# Patient Record
Sex: Female | Born: 1950 | Race: Black or African American | Hispanic: No | Smoking: Former smoker
Health system: Southern US, Community
[De-identification: ages and names within clinical notes are randomized; demographics above are authoritative.]

## PROBLEM LIST (undated history)

## (undated) DIAGNOSIS — I10 Essential (primary) hypertension: Secondary | ICD-10-CM

## (undated) DIAGNOSIS — R569 Unspecified convulsions: Secondary | ICD-10-CM

## (undated) DIAGNOSIS — K219 Gastro-esophageal reflux disease without esophagitis: Secondary | ICD-10-CM

## (undated) DIAGNOSIS — F419 Anxiety disorder, unspecified: Secondary | ICD-10-CM

## (undated) DIAGNOSIS — C801 Malignant (primary) neoplasm, unspecified: Secondary | ICD-10-CM

## (undated) DIAGNOSIS — M797 Fibromyalgia: Secondary | ICD-10-CM

## (undated) DIAGNOSIS — I5189 Other ill-defined heart diseases: Secondary | ICD-10-CM

## (undated) HISTORY — PX: HEMORRHOID SURGERY: SHX153

---

## 2001-03-19 ENCOUNTER — Ambulatory Visit (HOSPITAL_COMMUNITY): Admission: RE | Admit: 2001-03-19 | Discharge: 2001-03-19 | Payer: Self-pay | Admitting: General Practice

## 2001-03-19 ENCOUNTER — Encounter: Payer: Self-pay | Admitting: General Practice

## 2004-01-09 ENCOUNTER — Emergency Department (HOSPITAL_COMMUNITY): Admission: AD | Admit: 2004-01-09 | Discharge: 2004-01-10 | Payer: Self-pay | Admitting: Family Medicine

## 2004-05-22 ENCOUNTER — Encounter: Admission: RE | Admit: 2004-05-22 | Discharge: 2004-05-22 | Payer: Self-pay | Admitting: Internal Medicine

## 2006-12-06 ENCOUNTER — Ambulatory Visit: Payer: Self-pay | Admitting: Internal Medicine

## 2007-12-27 ENCOUNTER — Other Ambulatory Visit: Payer: Self-pay

## 2007-12-27 ENCOUNTER — Emergency Department: Payer: Self-pay | Admitting: Emergency Medicine

## 2010-01-19 ENCOUNTER — Emergency Department: Payer: Self-pay | Admitting: Emergency Medicine

## 2010-10-22 ENCOUNTER — Emergency Department: Payer: Self-pay | Admitting: Emergency Medicine

## 2010-10-29 ENCOUNTER — Emergency Department: Payer: Self-pay | Admitting: Emergency Medicine

## 2012-06-19 ENCOUNTER — Emergency Department: Payer: Self-pay | Admitting: *Deleted

## 2012-06-19 LAB — CBC WITH DIFFERENTIAL/PLATELET
Basophil #: 0 10*3/uL (ref 0.0–0.1)
Basophil %: 0.6 %
Eosinophil #: 0 10*3/uL (ref 0.0–0.7)
Eosinophil %: 0.3 %
HCT: 43.1 % (ref 35.0–47.0)
HGB: 14.5 g/dL (ref 12.0–16.0)
Lymphocyte #: 2.2 10*3/uL (ref 1.0–3.6)
Lymphocyte %: 34.2 %
MCH: 28.2 pg (ref 26.0–34.0)
MCHC: 33.5 g/dL (ref 32.0–36.0)
MCV: 84 fL (ref 80–100)
Monocyte #: 0.7 x10 3/mm (ref 0.2–0.9)
Monocyte %: 11.2 %
Neutrophil #: 3.4 10*3/uL (ref 1.4–6.5)
Neutrophil %: 53.7 %
Platelet: 198 10*3/uL (ref 150–440)
RBC: 5.12 10*6/uL (ref 3.80–5.20)
RDW: 14.5 % (ref 11.5–14.5)
WBC: 6.3 10*3/uL (ref 3.6–11.0)

## 2012-06-19 LAB — DRUG SCREEN, URINE
Amphetamines, Ur Screen: NEGATIVE (ref ?–1000)
Barbiturates, Ur Screen: NEGATIVE (ref ?–200)
Benzodiazepine, Ur Scrn: NEGATIVE (ref ?–200)
Cannabinoid 50 Ng, Ur ~~LOC~~: NEGATIVE (ref ?–50)
Cocaine Metabolite,Ur ~~LOC~~: NEGATIVE (ref ?–300)
MDMA (Ecstasy)Ur Screen: NEGATIVE (ref ?–500)
Methadone, Ur Screen: POSITIVE (ref ?–300)
Opiate, Ur Screen: NEGATIVE (ref ?–300)
Phencyclidine (PCP) Ur S: NEGATIVE (ref ?–25)
Tricyclic, Ur Screen: NEGATIVE (ref ?–1000)

## 2012-06-19 LAB — COMPREHENSIVE METABOLIC PANEL
Albumin: 4.2 g/dL (ref 3.4–5.0)
Alkaline Phosphatase: 89 U/L (ref 50–136)
Anion Gap: 8 (ref 7–16)
BUN: 11 mg/dL (ref 7–18)
Bilirubin,Total: 0.6 mg/dL (ref 0.2–1.0)
Glucose: 94 mg/dL (ref 65–99)
Osmolality: 273 (ref 275–301)
Potassium: 3.7 mmol/L (ref 3.5–5.1)
Sodium: 137 mmol/L (ref 136–145)
Total Protein: 8.8 g/dL — ABNORMAL HIGH (ref 6.4–8.2)

## 2012-06-19 LAB — URINALYSIS, COMPLETE
Bacteria: NONE SEEN
Bilirubin,UR: NEGATIVE
Glucose,UR: NEGATIVE mg/dL (ref 0–75)
Hyaline Cast: 18
Ketone: NEGATIVE
Nitrite: NEGATIVE
Ph: 5 (ref 4.5–8.0)
Protein: NEGATIVE
RBC,UR: 1 /HPF (ref 0–5)
Specific Gravity: 1.006 (ref 1.003–1.030)
Squamous Epithelial: <1
WBC UR: 1 /HPF (ref 0–5)

## 2012-06-19 LAB — ACETAMINOPHEN LEVEL: Acetaminophen: <2 ug/mL

## 2012-06-19 LAB — SALICYLATE LEVEL: Salicylates, Serum: 3.2 mg/dL — ABNORMAL HIGH

## 2012-08-24 ENCOUNTER — Inpatient Hospital Stay: Payer: Self-pay | Admitting: Internal Medicine

## 2012-08-24 LAB — COMPREHENSIVE METABOLIC PANEL
Alkaline Phosphatase: 80 U/L (ref 50–136)
Bilirubin,Total: 0.4 mg/dL (ref 0.2–1.0)
Chloride: 103 mmol/L (ref 98–107)
Co2: 33 mmol/L — ABNORMAL HIGH (ref 21–32)
Creatinine: 0.65 mg/dL (ref 0.60–1.30)
EGFR (African American): >60
EGFR (Non-African Amer.): >60
Osmolality: 274 (ref 275–301)
SGOT(AST): 34 U/L (ref 15–37)
SGPT (ALT): 30 U/L (ref 12–78)
Sodium: 139 mmol/L (ref 136–145)

## 2012-08-24 LAB — DRUG SCREEN, URINE
Amphetamines, Ur Screen: NEGATIVE (ref ?–1000)
Barbiturates, Ur Screen: NEGATIVE (ref ?–200)
Benzodiazepine, Ur Scrn: NEGATIVE (ref ?–200)
Cannabinoid 50 Ng, Ur ~~LOC~~: NEGATIVE (ref ?–50)
Cocaine Metabolite,Ur ~~LOC~~: NEGATIVE (ref ?–300)
Opiate, Ur Screen: NEGATIVE (ref ?–300)

## 2012-08-24 LAB — URINALYSIS, COMPLETE
Bacteria: NONE SEEN
Bilirubin,UR: NEGATIVE
Ph: 6 (ref 4.5–8.0)
Protein: NEGATIVE
RBC,UR: <1 /HPF (ref 0–5)

## 2012-08-24 LAB — CBC
MCH: 28.5 pg (ref 26.0–34.0)
MCHC: 33.5 g/dL (ref 32.0–36.0)
Platelet: 155 10*3/uL (ref 150–440)

## 2012-08-24 LAB — ACETAMINOPHEN LEVEL: Acetaminophen: <2 ug/mL

## 2012-08-25 LAB — LIPID PANEL
HDL Cholesterol: 38 mg/dL — ABNORMAL LOW (ref 40–60)
Ldl Cholesterol, Calc: 114 mg/dL — ABNORMAL HIGH (ref 0–100)

## 2012-08-29 LAB — BASIC METABOLIC PANEL
Anion Gap: 5 — ABNORMAL LOW (ref 7–16)
Calcium, Total: 8.9 mg/dL (ref 8.5–10.1)
Co2: 31 mmol/L (ref 21–32)
Creatinine: 0.65 mg/dL (ref 0.60–1.30)
EGFR (African American): >60
EGFR (Non-African Amer.): >60
Glucose: 77 mg/dL (ref 65–99)
Osmolality: 279 (ref 275–301)
Potassium: 4.4 mmol/L (ref 3.5–5.1)
Sodium: 140 mmol/L (ref 136–145)

## 2012-08-29 LAB — CBC WITH DIFFERENTIAL/PLATELET
Basophil #: 0 10*3/uL (ref 0.0–0.1)
Basophil %: 0.3 %
Eosinophil #: 0.1 10*3/uL (ref 0.0–0.7)
Eosinophil %: 1.4 %
HGB: 12.7 g/dL (ref 12.0–16.0)
Lymphocyte #: 2.3 10*3/uL (ref 1.0–3.6)
Monocyte #: 0.5 x10 3/mm (ref 0.2–0.9)
RBC: 4.59 10*6/uL (ref 3.80–5.20)
WBC: 4.9 10*3/uL (ref 3.6–11.0)

## 2012-08-30 LAB — URINALYSIS, COMPLETE
Bilirubin,UR: NEGATIVE
Blood: NEGATIVE
Glucose,UR: NEGATIVE mg/dL (ref 0–75)
Ketone: NEGATIVE
Ph: 6 (ref 4.5–8.0)
Squamous Epithelial: 2

## 2013-03-03 ENCOUNTER — Emergency Department: Payer: Self-pay | Admitting: Internal Medicine

## 2014-02-01 IMAGING — US US CAROTID DUPLEX BILAT
1 series · 14 of 24 positions shown · non-contrast
Comparison: none

REASON FOR EXAM: cva
COMMENTS:

[Series 1: us carotid duplex bilat · 14 of 68 slices shown]
[im 1/68]
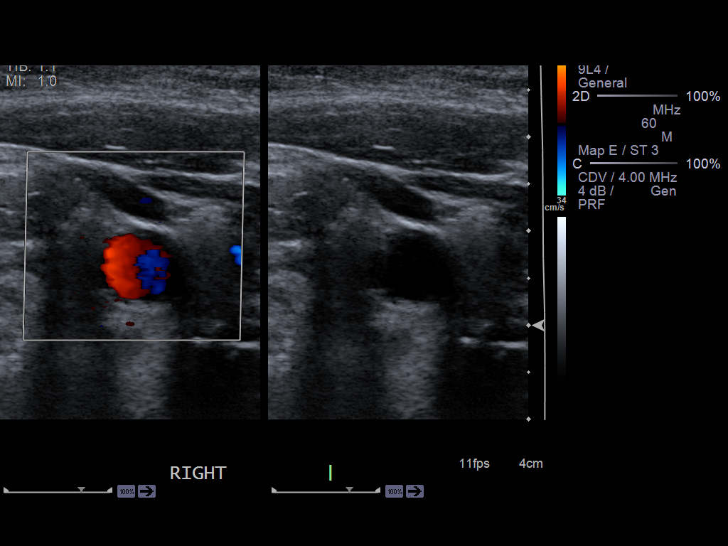
[im 6/68]
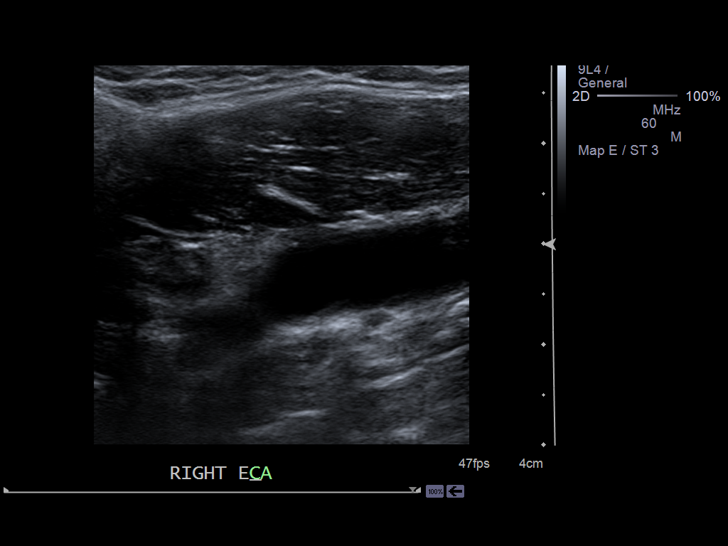
[im 12/68]
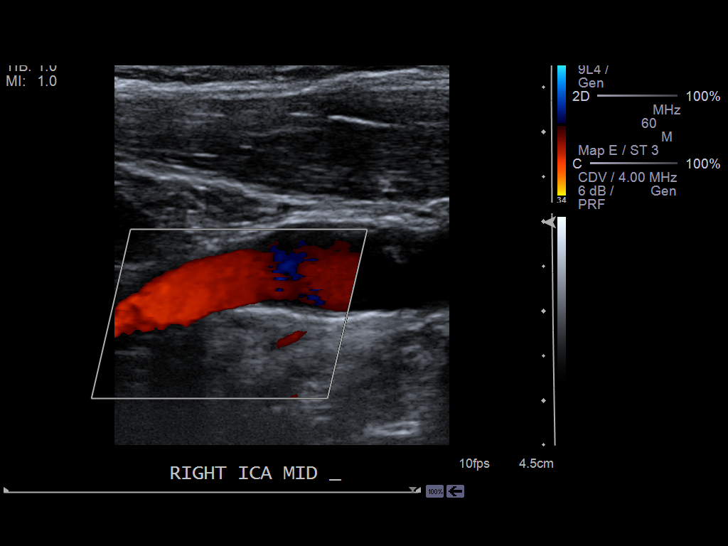
[im 18/68]
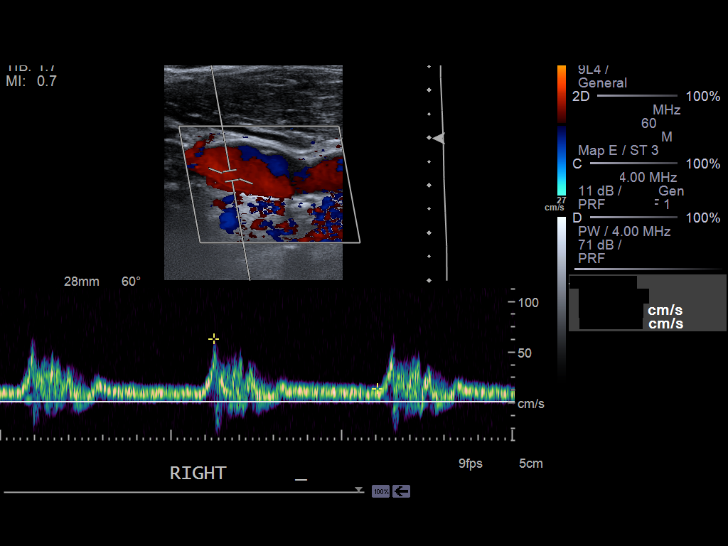
[im 21/68]
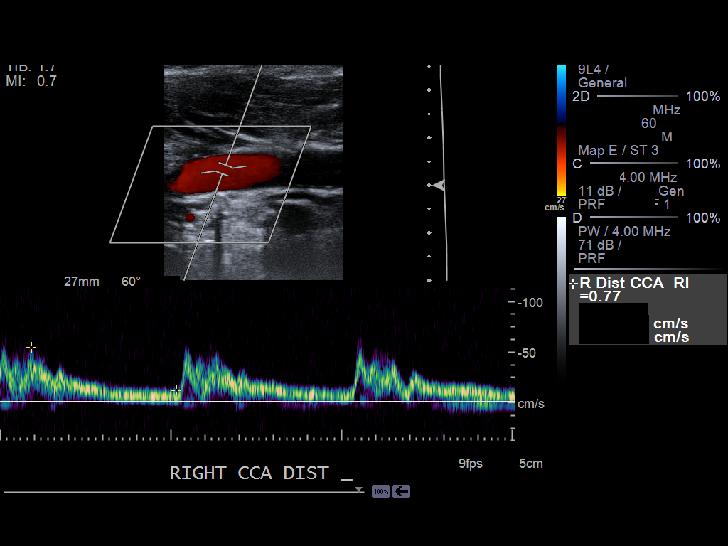
[im 27/68]
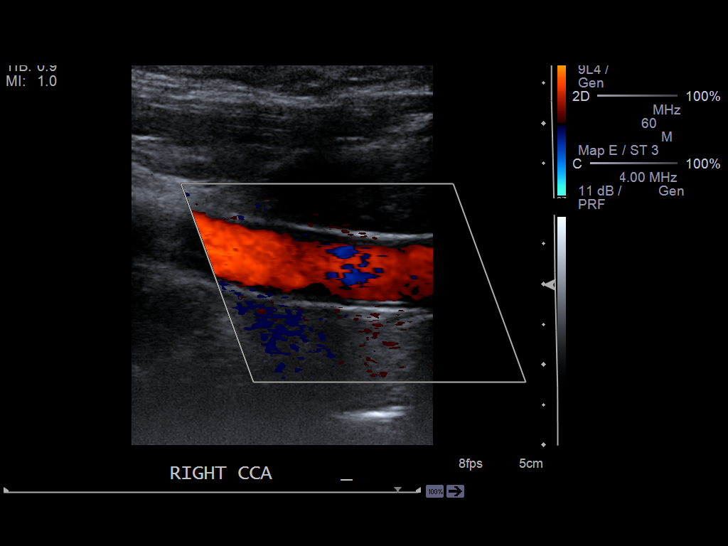
[im 33/68]
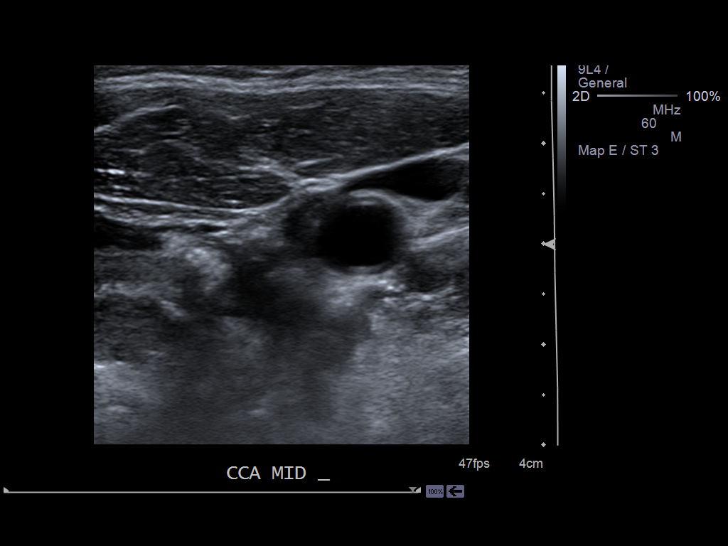
[im 35/68]
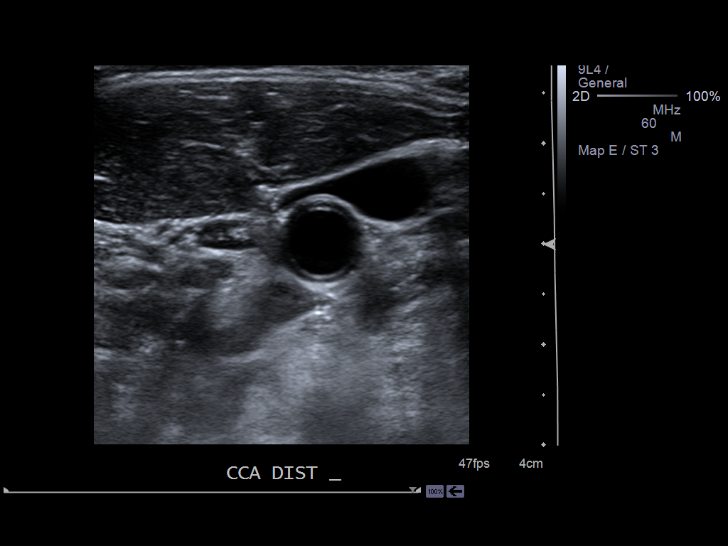
[im 41/68]
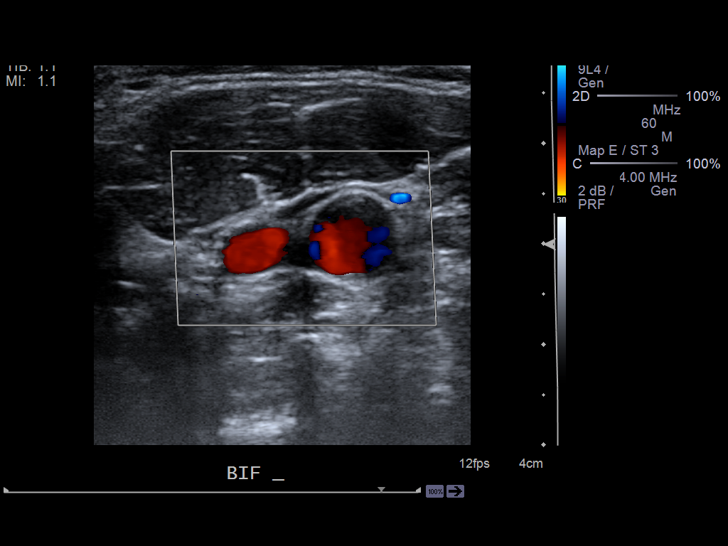
[im 47/68]
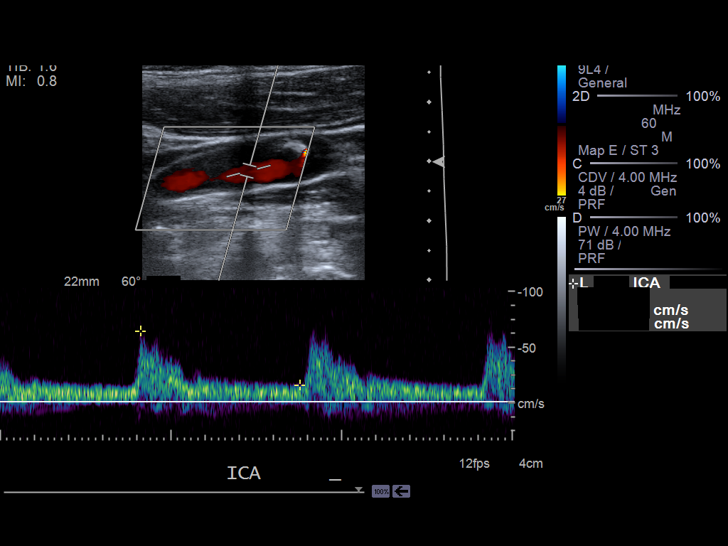
[im 53/68]
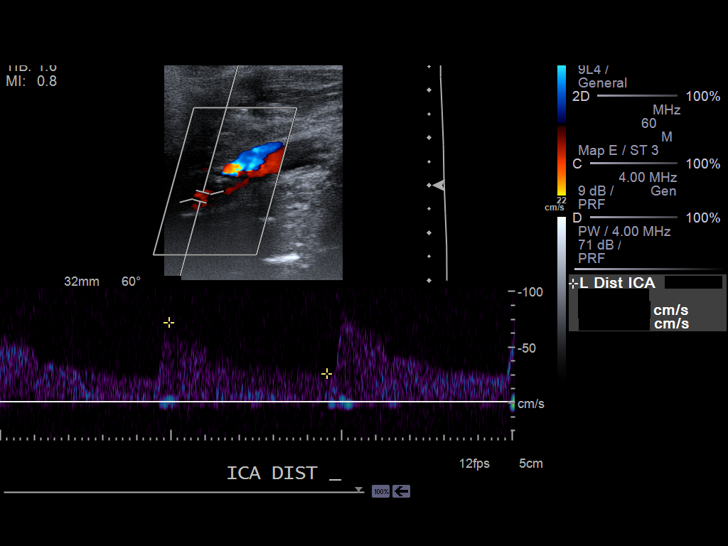
[im 56/68]
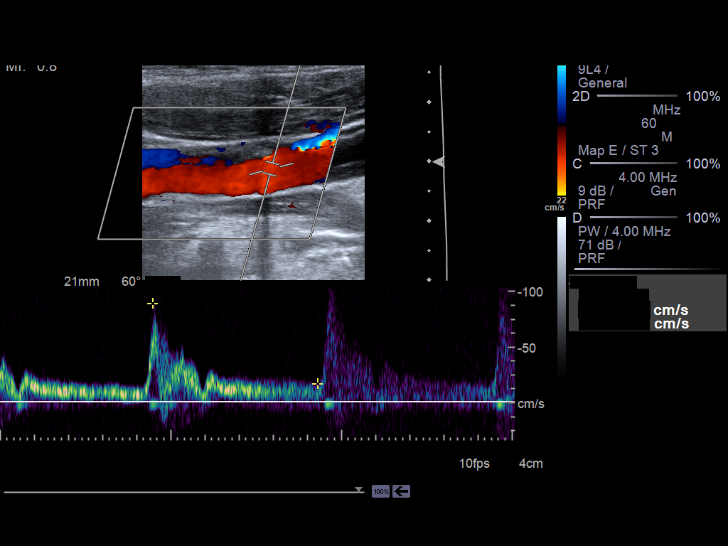
[im 62/68]
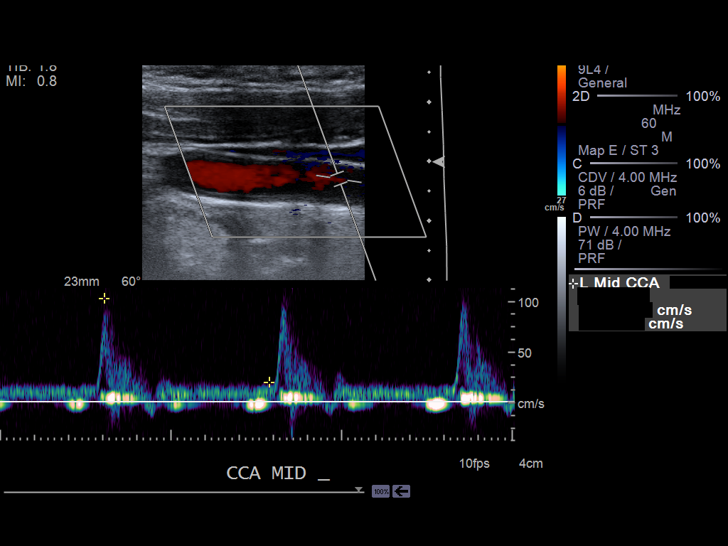
[im 68/68]
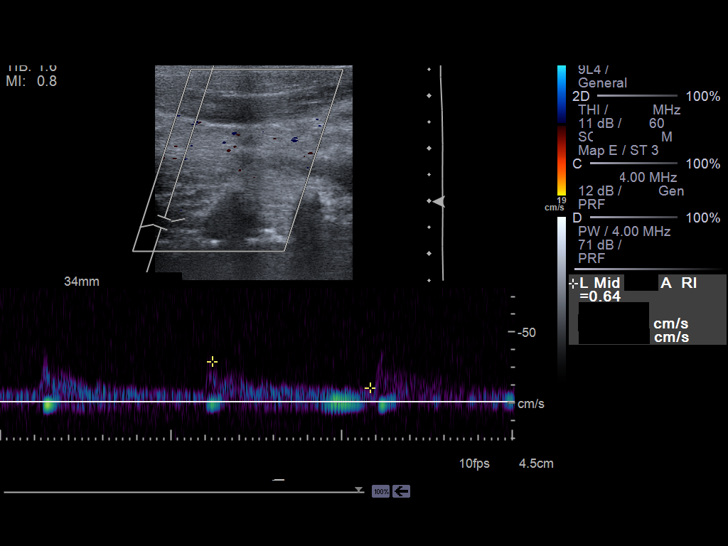

[14 of 24 positions shown; findings below may reference images not displayed]

PROCEDURE:     US  - US CAROTID DOPPLER BILATERAL  - August 25, 2012 [DATE]

RESULT:     Carotid Doppler interrogation is performed in the standard
fashion. There is atherosclerotic plaque bilaterally in the internal
carotids greater on the left. Visually there does not appear to be a large
amount of stenosis. There doesn't appear to be some ulceration in the plaque
in the area of the left carotid bulb. Color and spectral Doppler appearance
is normal. Antegrade flow is present in both vertebral arteries without flow
reversal. The peak systolic velocities are normal. The internal to common
carotid peak systolic velocity ratio is 1.101 the right and 0.82 normal left.
IMPRESSION: 1. Atherosclerotic disease without evidence of hemodynamically significant
stenosis. Ulceration is seen in the left carotid bulb region in an area of
calcified plaque at that level.

[REDACTED]

## 2015-01-20 NOTE — Consult Note (Signed)
PATIENT NAME:  Gail Willis, Gail Willis MR#:  [id number] DATE OF BIRTH:  18-Oct-1950  DATE OF CONSULTATION:  08/27/2012  REFERRING PHYSICIAN:   CONSULTING PHYSICIAN:  Gonzella Lex, MD  IDENTIFYING INFORMATION AND REASON FOR CONSULT: The patient is a 64 year old woman currently in the hospital for rule out a stroke, numbness and weakness in her leg. Consultation was for "severe depression, refusal of treatment".  Treatment team is particularly frustrated that she has declined to cooperate with getting a MRI and is seen as refusing to cooperate with the physical therapy.   HISTORY OF PRESENT ILLNESS: Information obtained from the patient and from the chart. This 64 year old woman was admitted to the hospital on 08/24/2012 complaining of acute inability to move her right leg. Work-up so far has not revealed a clear specific source for this. MRIs have been recommended, but the patient has declined to have the MRI stating that she is too claustrophobic to go into the MRI scanner. There is some frustration with the treatment team that she has not been showing improvement and does not seem to be as enthusiastic about rehabilitation treatment as would be appropriate. The patient is rather hard to interview. She was asleep or at least semi-asleep when I came to see her but woke up and was awake throughout the time I was there. Nevertheless, she still frequently seemed to have a waxing and waning mental state. At times her eyes would be wide open and she would appear lucid. At other times she would start to drift off back to sleep again with her eyes drooping and fall off to repeating herself. It was not clear to me whether some of this could have been volitional at times. She was quite difficult to engage in any clear conversation. Particularly, once she understood that I was a psychiatrist, she became defensive. The patient tells me she is upset about the fact that her right leg is still not getting better. She describes  the problem with it as being pins and needles, tingling and numbness, also says that it is just not working. She cannot give me a very clear history of the onset of it. She says that emotionally she feels like she is doing well, but at another point in the interview becomes tearful. She denies any psychotic symptoms. She totally denies any suicidal or homicidal ideation. She says that she has chronic anxiety problems which is why she cannot go in the MRI scanner. She becomes somewhat irate when I brought up the whole topic with her and was quite defensive about it. She tells me that she is trying to cooperate with her recovery as best she can. It appears that in addition to not participating much with rehab there is also concern that she is trying to get out of bed despite this weakness in her right leg and has on at least one occasion had a fall. Nursing seems to be on edge about her risk of falling because she keeps getting up on the side of the bed. The patient does not show any insight into why this might be a problem. I note that currently the patient is being given the doses of medication that she has quoted as receiving outside the hospital which includes clonazepam 2 mg three times a day as well as p.r.n. Valium and methadone on a rather odd and complicated schedule that adds up to 100 mg a day.   PAST PSYCHIATRIC HISTORY: The patient tells me she has only seen psychiatrists  occasionally here and there in the same context in which she is seeing me, that is to say in brief consultation. She denies ever being in a psychiatric ward, denies any ongoing psychiatric treatment. There is no documentation of psychiatric treatment in her hospital record.   PAST MEDICAL HISTORY: The patient evidently has a long-standing history of chronic pain problems that goes back for at least the entirety of her hospital chart here which goes back to 2004. She has been taking methadone throughout that time. It was described in  2004 as being arthritis. The patient tells me that she has "fibromyalgia". She says she has pain all over her body and indicates multiple sites without being able to focus on any one of them as the main locus of pain. She also is overweight and has a history of a possible seizure disorder. I do not see any evidence that this was ever actually documented by EEG and there was no specific neurologic finding, however, she has had multiple spells of losing consciousness and so has been treated empirically for seizures. Interestingly, the patient told me today that while she used to have seizures she no longer has them, they simply went away, even though she does not take any medication for seizures anymore. She has high blood pressure. Also has chronic obstructive pulmonary disease.   SOCIAL HISTORY: The patient indicates that she lives alone in a free standing house. She claims that normally she does all of her own care taking care of herself at home. During the time that I was interviewing her, two of her sisters came to visit from [place]. Apparently they do not see her all that frequently. They told me that she did seem quite sedated to them, but that she often seems over sedated and they blamed it on her medication. The patient is vague about her overall social history and activities.   SUBSTANCE ABUSE HISTORY: Denies any history of abuse of drugs or alcohol. She is on this high dose of clonazepam, again for no really clear reason, just that she has gotten on it and managed to continue to have people prescribe it for her.   REVIEW OF SYSTEMS: The patient complains of feeling generally anxious a lot of the time, complains of pain and weakness in her right leg. Denies feeling depressed, denies suicidal ideation, denies hallucinations or delusions, and denies that she is in any way refusing to cooperate with treatment.   MENTAL STATUS EXAM: The patient is an overweight somewhat sick-appearing woman  interviewed in her hospital room. She was a difficult interview. Her cooperation was intermittent. At times she would answer some questions directly, at other times she would seem to begin to nod off and would answer very irrelevantly or repeat herself or mumble. At some times, she appeared to be frankly evasive about questions. She actually told me towards the end of the interview that she did not ever want there to be any communication between doctors who treated her at [facility] and Dr. Kasandra Knudsen treated her in [place]. I pointed out to her that this really did not make a great deal of sense if she wanted the best possible medical treatment. She remained adamant but would not give me any reason for why she did not want there to be any communication. She had decreased eye contact overall. Psychomotor activity was frequently fidgety. She got up and sat on the side of the bed and fidgeted around a lot but did not fall. Affect was blunted,  labile on a couple of occasions, somewhat odd. Mood was stated as being okay. Thoughts were scattered and disorganized. At times she seemed to border on confusion like delirium. Denies hallucinations. Did not appear to be responding to internal stimuli. Denies suicidal ideation. Short and long-term memory both impaired. Intelligence and cognitive abilities hard to assess.   ASSESSMENT: This is a 64 year old woman currently being treated for weakness in her right leg of unclear etiology. There is concern about her behavior on the unit, her failure to cooperate with some recommended treatment, and apparently some of her odd affect and behavior. In my interview today, I found the patient to have a mental state that bordered on delirium at times. She appeared to be over sedated and confused. Some of this appeared to possibly be volitional. She did not give a history of depression. She denied any suicidal ideation. She was adamant that she was trying to cooperate with treatment. She did not  appear to be obviously psychotic and does not have a history of a psychotic disorder and did not appear to be manic. What jumps out at me very clearly is that she is on extremely high doses of abusable sedating medication. She is on 2 mg of clonazepam three times daily, which is the maximum allowable dose of that medication under any circumstance and yet there is no clear indication for why she is taking it. I do not think it is being given as a primary anticonvulsant. Also, she is taking a total of 100 mg of methadone on a rather bizarre and irrational schedule. The whole point of methadone is that it can be given as a once or at most twice a day medication because of its long half-life. Four times a day dosing does not make a great deal of sense with methadone. She is also taking 100 mg total a day, which is extremely high dose, usually only typical of a person with high-dose chronic opiate dependence maintenance and well above the dose usually used for pain. The patient does not have a physiologic pain syndrome that would usually justify the use of narcotics at all much less at such high doses. It appears that she has gotten up to such high doses and managed to get them prescribe continuously possibly by seeing doctors through the residents clinic or going from doctor to doctor at various times. In any case, I think that her medication is certainly contributing to her behavior. She is confused and oversedated.   TREATMENT PLAN: The patient does not have any insight into the fact that her medication dose is high and in appropriate and is causing her side effects and problems. Despite that I think that it is absolutely in her best interest to try and get her off of some of these medicines. I have gone ahead and cut her clonazepam dose down to 1 mg three times daily. I have tapered her methadone dose down slightly to a total of 80 mg a day from her current 100 mg. Both of these still leave her on high doses of  these medicines even though I am taking them down by quite a bit. I suspect that she will probably notice this and complain about it, but I think the risk of any serious bad outcome is low and the potential benefit is high. I would not currently add any other psychiatric medication. There is no other syndrome clearly to be treated with medicine. I have tried to give her some education and  supportive therapy about cooperating with the treatment team. I will continue to follow tomorrow. After that, over the holiday, I will be out of town. If other psychiatric evaluation is needed, please contact the psychiatric on-call physician.   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   SECONDARY DIAGNOSES:   AXIS I:  1. Delirium.  2. Iatrogenic opiate and benzodiazepine dependence.   AXIS II: Deferred.   AXIS III: Right leg weakness, discomfort, and numbness of unclear etiology, past history of seizures (now resolved), hypertension, and chronic obstructive pulmonary disease.              AXIS IV: Moderate from chronic illness.   AXIS V: Functioning at time of evaluation 35. ____________________________ Gonzella Lex, MD jtc:slb D: 08/28/2012 02:48:00 ET T: 08/28/2012 10:16:36 ET JOB#: [id number]  cc: Gonzella Lex, MD, <Dictator> Gonzella Lex MD ELECTRONICALLY SIGNED 08/28/2012 11:33

## 2015-01-20 NOTE — H&P (Signed)
PATIENT NAME:  Gail Willis, SCHEXNAYDER MR#:  [id number] DATE OF BIRTH:  [birth year]  DATE OF ADMISSION:  08/24/2012  PRIMARY CARE PHYSICIAN: At [facility], Dr Ellen Henri.    CHIEF COMPLAINT: Inability to move right lower extremity.   HISTORY OF PRESENT ILLNESS: A 64 year old female with a history of hypertension, chronic lower back pain who presents with above complaint. Patient is actually a very poor historian. However, I am able to gather some information from her.  Apparently she was on her way from the bedroom to a stool, and she just kind of felt very weak, was unable to move her legs. She actually had to slide herself to the bathroom near the phone where she called EMS and EMS came and brought her to the ER for evaluation.  On evaluation here she is unable to move that right lower extremity, and she has decreased sensation throughout her right leg. This happened sometime this afternoon, approximately probably around 3 or 3:30. The patient denies any falls. No head trauma. She denies any bowel or bladder incontinence.    REVIEW OF SYSTEMS: CONSTITUTIONAL: No fever, fatigue.  EYES: Has no blurred or double vision, glaucoma, or cataracts.  ENT: No tinnitus, ear pain, hearing loss, seasonal allergies, postnasal drip. RESPIRATORY: No cough, wheezing, hemoptysis, COPD.  CARDIOVASCULAR: She denies any chest pain, palpitations, orthopnea, syncope, edema, arrhythmia, dyspnea on exertion. GI: No nausea, vomiting, diarrhea, abdominal pain, melena, or ulcers.  She has GERD.  GU: No dysuria or hematuria.  ENDOCRINE: No polyuria or polydipsia. No increased sweating, heat or cold intolerance.  HEME/LYMPH: No anemia, easy bruising. SKIN: No rash or lesions.  MUSCULOSKELETAL: She is unable to move that right lower extremity. NEUROLOGIC: No past history of CVA, TIA, seizures.  PSYCHIATRIC: She does have some anxiety.   PAST MEDICAL HISTORY:  1. Fibromyalgia.  2. Anxiety.  3. Gastroesophageal reflux disease.  4. Chronic  obstructive pulmonary disease.  5. Hypertension.  6. Chronic low back pain.   MEDICATIONS:  1. Methadone 2 mg. That is a p.r.n. medication.   2. Enalapril 20 mg daily.  3. Advair Diskus 250/50 b.i.d.  4. Lasix 40 mg daily.  5. Clonazepam 2 mg p.o. b.i.d.   SOCIAL HISTORY: Patient smokes maybe 1 cigarette a month. No alcohol or IV drug use.   FAMILY HISTORY: Positive for cerebrovascular accident.   PAST SURGICAL HISTORY: Hysterectomy.   PHYSICAL EXAMINATION:   VITAL SIGNS: Temperature 98.5, pulse 72, respirations 18, blood pressure 203/125 and 94% on room air.    GENERAL: The patient is alert, oriented, not in acute distress.   HEENT: Head is atraumatic. Pupils are round and reactive. Sclerae anicteric. Mucous membranes are moist.  Oropharynx is clear.     NECK: Supple without jugular venous distention, carotid bruit, or enlarged thyroid.   CARDIOVASCULAR: Regular rate and rhythm. No murmurs, gallops, or rubs are heard.    LUNGS: Clear to auscultation without crackles, rales, rhonchi, or wheezing. Normal percussion.   ABDOMEN: Bowel sounds are positive. Nontender, nondistended. Hard to appreciate organomegaly due to body habitus.   BACK: There is no CVA or vertebral tenderness. She does have some kind of muscle spasms around the L2-L3 left to the vertebrae.     EXTREMITIES: 1+ minimal pitting edema bilaterally.   NEUROLOGIC: Cranial nerves II through XII are intact. Her right lower extremity she is unable to move. She has decreased sensation all the way up to the level of the knee. Left lower extremity is a 5 out  of 5 with good sensation in the right knee, also has no reflexes. Babinski sign is downgoing in both lower extremities.   LABORATORIES: Urine toxicology is positive for methadone. Tylenol less than 2, salicylate 1.8. Alcohol level less than 3.  Urinalysis shows no leukocyte esterase or nitrites. Sodium 139, potassium 3.9, chloride 103, bicarbonate 33, BUN 7, creatinine  0.65, glucose 78, calcium 9.2.  Bilirubin 0.4, alkaline phosphatase 80, ALT 30, AST 34, total protein 7.6, albumin is 3.7. White blood cells 5.8, hemoglobin 13.6, hematocrit 41, platelets are 155,000. Ammonia less than 25. CT of the head shows no acute intracranial hemorrhage or cerebrovascular accident. Lumbar spine shows no acute osseous abnormalities. Unable to locate an EKG.  ASSESSMENT AND PLAN:  A 64 year old female who presents with inability to move the right lower extremity.  1. Acute cerebrovascular accident.  Patient is unable to move the right lower extremity. She has malignant hypertension. The possibilities of a CVA are likely. I will order neuro checks q.4 hours, MRI, carotid Dopplers, and echocardiogram, aspirin, statin therapy as well as physical therapy consult. If the workup for a stroke is negative, I would consider an MRI of the lumbar spine, especially given her history of chronic lower back pain.  2. Malignant hypertension, possible related to cerebrovascular accident (CVA) and her right lower extremity weakness. If this is a CVA we will allow for some permissive hypertension, systolic blood pressure less than 180 is tolerable.  I will write for some hydralazine p.r.n. with parameters.  3. Anxiety. Continue Klonopin.   CODE STATUS: THE PATIENT IS FULL CODE STATUS.  TIME SPENT: Approximately 55 minutes.      ____________________________ Donell Beers. Benjie Karvonen, MD spm:vtd D: 08/24/2012 22:45:24 ET T: 08/25/2012 07:34:27 ET JOB#: [id number]  cc: [place] P. Benjie Karvonen, MD, <Dictator> Ellen Henri, MD, at [facility] Milo Schreier MD ELECTRONICALLY SIGNED 08/26/2012 19:29

## 2015-01-20 NOTE — Consult Note (Signed)
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   Patient was seen by consultant.   Consult note dictated.   Recommend further assessment or treatment.   Orders entered.   Comments: Psychiatry: Patient seen and interviewed. Chart reviewed. Very odd and difficult to interview patient. Appears oversedated to the point of near delirium at times. Patient gets irate at the notion that she is not trying her hardest to get well. The thing that stands out to me is that she is on extremely high doses of both clonazepam and methadone for no really good reason. Intoxication on both of these would explain her presentation. I will start by cutting her clonazepam in 1/2 and cutting the methadone from 100mg  a day total to 80mg  a day. I am sure she will complain but she will also become less delirious.  Electronic Signatures: Gonzella Lex (MD)  (Signed [phone] 00:30)  Authored: Brief Consult Note   Last Updated: 26-Nov-13 00:30 by Gonzella Lex (MD)

## 2015-01-20 NOTE — Discharge Summary (Signed)
PATIENT NAME:  Gail Willis, Gail Willis MR#:  [id number] DATE OF BIRTH:  [birth year]  DATE OF ADMISSION:  08/24/2012 DATE OF DISCHARGE:  08/31/2012  DISCHARGE DIAGNOSES:  1. Spinal stenosis and degenerative disk disease.  2. Chronic pain syndrome.  3. Hyperlipidemia.  4. Chronic obstructive pulmonary disease.  5. Hypertension.  6. Severe anxiety/depression.   HOSPITAL CONSULTANTS:  1. Dr. Weber Cooks from Psychiatry   2. Dr. Mack Guise from Orthopedics    IMAGING STUDIES:  1. CT scan of the head showed no acute intracranial abnormalities.   2. X-ray of the lumbar spine showed no acute abnormalities.  3. Carotid duplex showed no evidence of hemodynamically significant carotid stenosis.  4. MRI of the lumbar spine was done which showed degenerative disk disease at L4-5, L5-S1 with neural foraminal narrowing, mild to moderate, with no evidence of thecal sac stenosis.   ADMITTING HISTORY AND PHYSICAL AND HOSPITAL COURSE: Please see previously dictated interim summary on 08/29/2012.  1. Fall and lower extremity weakness. The patient was admitted to the hospitalist with concern for possible stroke. The patient had a CT of the head which was negative and carotid Doppler's which were negative. The patient was ordered an MRI but secondary to severe claustrophobia could not tolerate the MRI. On further questioning, the patient's symptoms did not seem like a stroke and was thought to have problems with her LS-spine. A CT scan was done which showed neural foraminal narrowing without thecal stenosis. Orthopedic consult was sought and she was advised further follow-up with her spine specialist as outpatient with no acute needs. She worked with physical therapy who advised home health. The patient walked with a walker on her own in the hallway and was discharged home in fair condition and follow-up with a spine specialist.  2. Anxiety/depression. The patient had bouts of panic attacks and anxiety. Psychiatry was  consulted. The patient was on high dose of Klonopin and methadone for unclear reasons which were reduced in dose and the patient improved well and was set up with follow-up with Psychiatry as outpatient.  3. Her COPD, hyperlipidemia, and hypertension were fairly controlled in the hospital.   On the day of discharge, the patient's temperature is 98.4, blood pressure 119/73, pulse 83, saturating 95% on room air with normal neurological examination and sent home in fair condition.   DISCHARGE MEDICATIONS:  1. Advair Diskus 250/50 one puff inhaled two times a day.  2. Nexium 40 mg oral once a day.  3. Lasix 40 mg oral once a day.  4. Klonopin 2 mg oral 2 times a day as needed.  5. Enalapril 20 mg oral once a day.  6. Methadone 10 mg 3 tablets oral at 8 a.m., 2 tablets at 2 p.m., 3 tablets at 8 p.m., and 2 tablets at 2 a.m.  7. Levaquin 500 mg oral once a day for two days.  8. Hydrochlorothiazide 25 mg oral once a day.   DISCHARGE INSTRUCTIONS:  1. Cardiac diet.  2. Activity as tolerated using a walker.  3. Continue with physical therapy which has been set up through home health.  4. Return to the Emergency Room if symptoms worsen.   TIME SPENT: Time spent on the day of discharge in discharge activity was 55 minutes.   ____________________________ Leia Alf Praise Dolecki, MD srs:drc D: 09/14/2012 11:12:07 ET T: 09/14/2012 11:27:33 ET JOB#: [id number]  cc: Alveta Heimlich R. Lindsey Hommel, MD, <Dictator> Neita Carp MD ELECTRONICALLY SIGNED 09/16/2012 23:36

## 2015-01-20 NOTE — Consult Note (Signed)
Brief Consult Note: Diagnosis: Disc herniations lumbar spine.   Patient was seen by consultant.   Consult note dictated.   Recommend further assessment or treatment.   Discussed with Attending MD.   Comments: Patient has neuroforminal narrowing at L4-5 and L5-S1 on her MRI with degenerative disc disease.  She states she has numbness in the right lower extremity and does not move her toes, ankle or extend her knee on the right side during my exam, but states she walked to the nurses station today.  She denies bowel or bladder dysfunction.  Patient has refused to take oral steroid taper.  Patient would be a candidate for evaluation by the Pain Service for possible injection.  I recommend a PT evaluation and she will likely need SNF placement.  Patient states her PCP at [facility] has already recommended she see a spine specialist.  I would recommend she follow up with this specialist as we have no surgical spine services here at [facility].  Electronic Signatures: Thornton Park (MD)  (Signed [phone] 18:26)  Authored: Brief Consult Note   Last Updated: 27-Nov-13 18:26 by Thornton Park (MD)

## 2015-01-20 NOTE — Consult Note (Signed)
Details:    - Psychiatry: Followup on this patient who is currently being treated for symptoms of weakness in her right leg. Consult was done yesterday because of concerns about lack of motivation and effort on her part. I came by to see patient again today. She is neat and well groomed and wide awake. Eye contact was still intermittent. Psychomotor activity was still a little bit fidgety but less disorganized than it was yesterday. Speech still tends to stray off into odd conversation and answers to questions that haven't been asked but I think overall it was a little more clear than yesterday. Her affect was smiling and upbeat. She said that her mood was feeling okay. She denied any suicidal or homicidal ideation. She told me that she has been having lucid dreams in which she will wake up and believe that she is still talking to her late husband or other relatives who are not in the room. She does not appear to be behaving in a manner that is acutely dangerous. I do note that she cooperated with physical therapy today and managed to walk all the way to her door and back. I see that case management has met with her and discussed rehabilitation placement if she can be compliant.  No sign of any acute withdrawal problems from the decrease in medication. I still think that she looks a little bit sedated and almost delirious at times which is probably due to her high doses of medicine although there may be an underlying oddness to her behavior as well. I would not add any new medications right now. I would also not make any further changes at the moment in her Klonopin and methadone because those need to be done slowly but I would recommend that those continued to be tapered over the next week or 2. I think she would do much better off of all of the controlled substances. Supportive therapy done. Education done. I will followup with her after the weekend if she is still in the hospital. If psychiatric assistance as  needed in the meantime please call the psychiatrist on call.   Electronic Signatures: Gonzella Lex (MD)  (Signed [id number] 14:59)  Authored: Details   Last Updated: 26-Nov-13 14:59 by Gonzella Lex (MD)

## 2015-01-20 NOTE — Consult Note (Signed)
PATIENT NAME:  Gail Willis, Gail Willis MR#:  [id number] DATE OF BIRTH:  [birth year]  DATE OF CONSULTATION:  08/29/2012  REFERRING PHYSICIAN:  Abel Presto, MD  CONSULTING PHYSICIAN:  Timoteo Gaul, MD  REASON FOR CONSULTATION: Low back pain and radicular symptoms.   HISTORY OF PRESENT ILLNESS: Ms. Gail Willis is a 64 year old female with a history of hypertension and chronic low back pain who was admitted on 08/24/2012 with complaint of inability to move her right lower extremity. According to the original history and physical exam on admission, the patient had sudden onset of right lower extremity weakness and numbness.   Today at the bedside the patient's daughter is present in the room. The patient is sitting on the side of her bed. She states that she does not have feeling in the right lower extremity and although she states she can move her toes on the right side she was unable to do so on the exam. She did mention that she walked to the nurse's station today. The patient, however, states that she has been bedbound and has not been seen a physical therapist but would like to do so. The patient states that her primary care physician, Dr. Ellen Willis, at [facility] has already recommended that she see a spine specialist but she was acutely brought here to [facility] the other night when she had the sudden onset of the right lower extremity symptoms.   PAST MEDICAL HISTORY:  1. Fibromyalgia.  2. Anxiety. 3. Gastroesophageal reflux.  4. Chronic obstructive pulmonary disease. 5. Hypertension. 6. Chronic low back pain.  MEDICATIONS:  1. Methadone. 2. Enalapril. 3. Advair Diskus. 4. Lasix. 5. Clonazepam.   SOCIAL HISTORY: The patient denied alcohol or IV drug use. She states that she smokes very infrequently.   PHYSICAL EXAMINATION: Today the patient was sitting on the side of her bed. She had no obvious deformity of the lower extremities. She had intact sensation to light  touch in the left lower extremity but was unable to feel me touching her anterior thigh, the lateral and medial lower leg or the dorsal plantar and medial and lateral surfaces of her foot and the first dorsal web space. The patient was unable to flex or extend her toes or move her ankle or extend her knee on exam at the bedside today. On the left side the patient was able to perform all these activities to command. She had palpable pedal pulses bilaterally. Her feet were warm and well perfused. The patient could passively move all over her joints in the lower extremity without significant pain.   RADIOLOGY: I reviewed the patient's MRI which was performed on 08/28/2012. Prior to this the patient refused an MRI due to claustrophobia according to the medical service. The MRI showed degenerative disk disease with neuroforaminal narrowing at L4-5 and L5-S1. The L4-5 level showed a broad disk bulge with lateralization to the right and left with mild bilateral foraminal narrowing possibly compressing the exiting nerve roots. At L5-S1 again there was lateralization to the right side with mild to moderate neural foraminal narrowing with possible exiting nerve root compromise. There was no evidence of fracture.   ASSESSMENT:  1. Lumbar herniated disk with neuroforaminal narrowing.  2. Degenerative disk disease at L4-5 and L5-S1.   PLAN: I explained to Gail Willis that, unfortunately, I am not a spine specialist and no one here at [facility] provides surgical spine services. I did explain that steroid medication can help in certain  instances with a herniated disk. She has refused an oral steroid taper according to the nurse. The patient would be a candidate for evaluation from the Pain Management service for possible injection during her hospitalization. However, today the patient states that she was able to walk to the nurse's station. I would recommend further physical therapy evaluation with  a walker for assistance with ambulation. Based on the patient's appearance this evening, I would expect that the patient would need some skilled nursing stay at a skilled nursing facility to work on rehab as well as strengthening of her right lower extremity. The patient may ultimately need evaluation by a spine specialist. This could be coordinated with her primary care physician.     According to the patient, she has already recommended that the patient see a spine specialist at [facility]. I encouraged the patient to continue to pursue this with Gail Willis as nothing can be done locally on this campus from a surgical standpoint to help improve her symptoms. I did discuss this case with Dr. Verdell Carmine.   ____________________________ Timoteo Gaul, MD [id number] D: 08/29/2012 18:36:51 ET T: 08/30/2012 10:58:33 ET JOB#: [id number]  cc: Timoteo Gaul, MD, <Dictator> Timoteo Gaul MD ELECTRONICALLY SIGNED 09/03/2012 17:25

## 2016-04-10 ENCOUNTER — Emergency Department: Payer: Medicare PPO

## 2016-04-10 ENCOUNTER — Emergency Department
Admission: EM | Admit: 2016-04-10 | Discharge: 2016-04-10 | Disposition: A | Discharge status: Home / Self Care | Payer: Medicare PPO | Attending: Emergency Medicine | Admitting: Emergency Medicine

## 2016-04-10 ENCOUNTER — Encounter: Payer: Self-pay | Admitting: Emergency Medicine

## 2016-04-10 DIAGNOSIS — M179 Osteoarthritis of knee, unspecified: Secondary | ICD-10-CM | POA: Insufficient documentation

## 2016-04-10 DIAGNOSIS — M25462 Effusion, left knee: Secondary | ICD-10-CM | POA: Insufficient documentation

## 2016-04-10 DIAGNOSIS — Y939 Activity, unspecified: Secondary | ICD-10-CM | POA: Insufficient documentation

## 2016-04-10 DIAGNOSIS — I110 Hypertensive heart disease with heart failure: Secondary | ICD-10-CM | POA: Diagnosis not present

## 2016-04-10 DIAGNOSIS — Y929 Unspecified place or not applicable: Secondary | ICD-10-CM | POA: Diagnosis not present

## 2016-04-10 DIAGNOSIS — Y999 Unspecified external cause status: Secondary | ICD-10-CM | POA: Insufficient documentation

## 2016-04-10 DIAGNOSIS — W19XXXA Unspecified fall, initial encounter: Secondary | ICD-10-CM | POA: Diagnosis not present

## 2016-04-10 DIAGNOSIS — R55 Syncope and collapse: Secondary | ICD-10-CM | POA: Insufficient documentation

## 2016-04-10 DIAGNOSIS — Z8669 Personal history of other diseases of the nervous system and sense organs: Secondary | ICD-10-CM | POA: Insufficient documentation

## 2016-04-10 DIAGNOSIS — S8002XA Contusion of left knee, initial encounter: Secondary | ICD-10-CM | POA: Diagnosis not present

## 2016-04-10 DIAGNOSIS — I509 Heart failure, unspecified: Secondary | ICD-10-CM | POA: Diagnosis not present

## 2016-04-10 DIAGNOSIS — M25562 Pain in left knee: Secondary | ICD-10-CM | POA: Diagnosis present

## 2016-04-10 HISTORY — DX: Gastro-esophageal reflux disease without esophagitis: K21.9

## 2016-04-10 HISTORY — DX: Other ill-defined heart diseases: I51.89

## 2016-04-10 HISTORY — DX: Fibromyalgia: M79.7

## 2016-04-10 HISTORY — DX: Anxiety disorder, unspecified: F41.9

## 2016-04-10 HISTORY — DX: Essential (primary) hypertension: I10

## 2016-04-10 HISTORY — DX: Unspecified convulsions: R56.9

## 2016-04-10 LAB — BASIC METABOLIC PANEL
Anion gap: 7 (ref 5–15)
BUN: 12 mg/dL (ref 6–20)
CHLORIDE: 103 mmol/L (ref 101–111)
CO2: 28 mmol/L (ref 22–32)
CREATININE: 0.59 mg/dL (ref 0.44–1.00)
Calcium: 9 mg/dL (ref 8.9–10.3)
GFR calc Af Amer: >60 mL/min (ref >60)
GFR calc non Af Amer: >60 mL/min (ref >60)
GLUCOSE: 97 mg/dL (ref 65–99)
POTASSIUM: 4.2 mmol/L (ref 3.5–5.1)
SODIUM: 138 mmol/L (ref 135–145)

## 2016-04-10 LAB — URINALYSIS COMPLETE WITH MICROSCOPIC (ARMC ONLY)
BILIRUBIN URINE: NEGATIVE
Glucose, UA: NEGATIVE mg/dL
Hgb urine dipstick: NEGATIVE
KETONES UR: NEGATIVE mg/dL
Nitrite: NEGATIVE
PH: 5 (ref 5.0–8.0)
Protein, ur: NEGATIVE mg/dL
Specific Gravity, Urine: 1.009 (ref 1.005–1.030)

## 2016-04-10 LAB — CBC
HEMATOCRIT: 39.1 % (ref 35.0–47.0)
Hemoglobin: 13.2 g/dL (ref 12.0–16.0)
MCH: 28 pg (ref 26.0–34.0)
MCHC: 33.7 g/dL (ref 32.0–36.0)
MCV: 83 fL (ref 80.0–100.0)
PLATELETS: 135 10*3/uL — AB (ref 150–440)
RBC: 4.71 MIL/uL (ref 3.80–5.20)
RDW: 15.1 % — AB (ref 11.5–14.5)
WBC: 4.5 10*3/uL (ref 3.6–11.0)

## 2016-04-10 LAB — TROPONIN I: Troponin I: <0.03 ng/mL (ref <0.03)

## 2016-04-10 LAB — GLUCOSE, CAPILLARY: GLUCOSE-CAPILLARY: 92 mg/dL (ref 65–99)

## 2016-04-10 NOTE — ED Notes (Signed)
Pt c/o bilateral knee pain. States " i think my knee is broke". Needs 2 knee replacements. Pt drowsy still and asleep when rn entered room

## 2016-04-10 NOTE — Discharge Instructions (Signed)
Please return to the emergency department for further fainting spells, lightheadedness or dizziness, numbness tingling or weakness, severe pain or headache, fever, inability to keep down fluids, chest pain or palpitations, shortness of breath, or any other symptoms concerning to you.

## 2016-04-10 NOTE — ED Provider Notes (Signed)
[facility] Emergency Department Provider Note  ____________________________________________  Time seen: Approximately 2:18 PM  I have reviewed the triage vital signs and the nursing notes.   HISTORY  Chief Complaint Loss of Consciousness    HPI Gail Willis is a 65 y.o. female with a history of bilateral chronic knee pain from osteoarthritis, fibromyalgia, HTN, CHF and seizures presenting for syncope. The patient stood up after being seated for a long time at church and had a brief syncopal episode. She describes a preceding mild headache but no visual changes, speech changes, chest pain, palpitations or shortness of breath. Her mild headache is completely resolved at this time. The syncope was brief and she immediately new where she was and what had happened. She did not injure herself when she fell. No seizure-like activity. The patient has been feeling well other than her chronic pain over the last couple of days and denies any nausea vomiting or diarrhea, fevers or chills, cough or cold symptoms. No numbness tingling or weakness or difficulty walking. No recent changes in her medications.   Past Medical History  Diagnosis Date  . Fibromyalgia   . Seizures ([place])   . Anxiety   . Acid reflux   . Hypertension   . Diastolic dysfunction     There are no active problems to display for this patient.   Past Surgical History  Procedure Laterality Date  . Hemorrhoid surgery      No current outpatient prescriptions on file.  Allergies Sulfa antibiotics  History reviewed. No pertinent family history.  Social History Social History  Substance Use Topics  . Smoking status: Current Every Day Smoker  . Smokeless tobacco: None  . Alcohol Use: No    Review of Systems Constitutional: No fever/chills.Positive syncope. Negative lightheadedness. Eyes: No visual changes. No blurred or double vision. ENT: No sore throat. No congestion or  rhinorrhea. Cardiovascular: Denies chest pain. Denies palpitations. Respiratory: Denies shortness of breath.  No cough. Gastrointestinal: No abdominal pain.  No nausea, no vomiting.  No diarrhea.  No constipation. Genitourinary: Negative for dysuria. Musculoskeletal: Negative for back pain. Positive chronic bilateral knee pain which is unchanged. No calf pain, or new lower cavity swelling. Skin: Negative for rash. Neurological: Mild headache preceding syncope which has resolved.. No focal numbness, tingling or weakness.   10-point ROS otherwise negative.  ____________________________________________   PHYSICAL EXAM:  VITAL SIGNS: ED Triage Vitals  Enc Vitals Group     BP 04/10/16 1353 133/60 mmHg     Pulse Rate 04/10/16 1353 67     Resp 04/10/16 1353 14     Temp --      Temp src --      SpO2 04/10/16 1353 99 %     Weight --      Height 04/10/16 1353 5\' 2"  (1.575 m)     Head Cir --      Peak Flow --      Pain Score --      Pain Loc --      Pain Edu? --      Excl. in [place]? --     Constitutional: Alert and oriented. Chronically ill-appearing, mildly uncomfortable but in no acute distress. Answers questions appropriately. GCS 15. Eyes: Conjunctivae are normal.  EOMI. No scleral icterus. Head: Atraumatic. No raccoon eyes or Battle sign. Nose: No congestion/rhinnorhea. Mouth/Throat: Mucous membranes are moist. No dental injury or malocclusion. Neck: No stridor.  Supple.  No midline C-spine tenderness to palpation, step-offs  or deformities. No meningismus. Full range of motion without pain. Cardiovascular: Normal rate, regular rhythm. No murmurs, rubs or gallops.  Respiratory: Normal respiratory effort.  No accessory muscle use or retractions. Lungs CTAB.  No wheezes, rales or ronchi. Gastrointestinal: Obese. Soft, nontender and nondistended.  No guarding or rebound.  No peritoneal signs. Musculoskeletal: No LE edema. No ttp in the calves or palpable cords.  Negative Homan's sign.  Some pain with range of motion of the bilateral knees but no evidence of acute effusion, ecchymosis, erythema or warmth. 3x3cm ecchymosis medial aspect of left knee. Neurologic:  A&Ox3.  Speech is clear.  Face and smile are symmetric.  EOMI.  Moves all extremities well. Skin:  Skin is warm, dry and intact. No rash noted. Psychiatric: Flat affect with depressed mood. The patient intermittently falls asleep during my examination but is easily awoken to verbal stimulus. Speech and behavior are normal.  Normal judgement.  ____________________________________________   LABS (all labs ordered are listed, but only abnormal results are displayed)  Labs Reviewed  CBC - Abnormal; Notable for the following:    RDW 15.1 (*)    Platelets 135 (*)    All other components within normal limits  URINALYSIS COMPLETEWITH MICROSCOPIC ([facility] ONLY) - Abnormal; Notable for the following:    Color, Urine YELLOW (*)    APPearance CLEAR (*)    Leukocytes, UA TRACE (*)    Bacteria, UA RARE (*)    Squamous Epithelial / LPF 0-5 (*)    All other components within normal limits  URINE CULTURE  BASIC METABOLIC PANEL  TROPONIN I  GLUCOSE, CAPILLARY  CBG MONITORING, ED   ____________________________________________  EKG  ED ECG REPORT I, Eula Listen, the attending physician, personally viewed and interpreted this ECG.   Date: 04/10/2016  EKG Time: 1352  Rate: 69  Rhythm: normal sinus rhythm  Axis: normal  Intervals:none  ST&T Change: Nonspecific T-wave inversion in V1. No ST elevation.  ____________________________________________  RADIOLOGY  Dg Chest 2 View  04/10/2016  CLINICAL DATA:  Syncope.  Hypertension. EXAM: CHEST  2 VIEW COMPARISON:  October 22, 2010 FINDINGS: There is slight left base atelectasis. Lungs elsewhere clear. Heart is upper normal in size with pulmonary vascularity within normal limits. No adenopathy. There is degenerative change in the thoracic spine. IMPRESSION: Slight  left base atelectasis.  No edema or consolidation. Electronically Signed   By: Lowella Grip III M.D.   On: 04/10/2016 14:46   Dg Knee Complete 4 Views Left  04/10/2016  CLINICAL DATA:  Bilateral knee pain, left greater than right. EXAM: LEFT KNEE - COMPLETE 4+ VIEW COMPARISON:  None. FINDINGS: No fracture or dislocation is seen. Mild to moderate tricompartmental degenerative changes, most prominent in the medial and patellofemoral compartments. Mild medial compartment chondrocalcinosis. Small suprapatellar knee joint effusion. IMPRESSION: Mild to moderate degenerative changes with medial compartment chondrocalcinosis. Small suprapatellar knee joint effusion. Electronically Signed   By: Julian Hy M.D.   On: 04/10/2016 16:14    ____________________________________________   PROCEDURES  Procedure(s) performed: None  Critical Care performed: No ____________________________________________   INITIAL IMPRESSION / ASSESSMENT AND PLAN / ED COURSE  Pertinent labs & imaging results that were available during my care of the patient were reviewed by me and considered in my medical decision making (see chart for details).  65 y.o. female presenting with a brief syncopal episode after standing after prolonged period of sitting down at church. There is no evidence of trauma associated with her syncope. There is  no associated chest pain or palpitations. It is possible the patient had a vasovagal event, although an intermittent arrhythmia is not excluded at this time. The patient has no signs or symptoms that would be consistent with an acute cardiac cause for syncope. The patient does have a significant amount of narcotic use for her chronic pain, and her syncope may have been related to this; she does continue to have some somnolence on my exam which is also consistent with her taking her pain medication while in the room despite being asked to wait by the nursing staff. We'll check basic labs,  and a chest x-ray to rule out any acute causes of syncope, but if her workup here is reassuring and she has no acute findings, I'll plan to have her follow-up with her primary care physician for further evaluation.  ----------------------------------------- 4:17 PM on 04/10/2016 -----------------------------------------  The patient's workup in the emergency department is reassuring. She is not orthostatic, she is not anemic and has normal electrolytes. Her urinalysis shows rare bacteria with some leukocytes but she is not having any urinary symptoms I will send a culture. Her EKG does not show ischemic changes or arrhythmia, and her chest x-ray does not show any acute cardiopulmonary process. At this time, we'll plan to discharge the patient home, and have her follow-up with her primary care physician and the one next 1-2 days. She understands return precautions as well as follow-up instructions.  ____________________________________________  FINAL CLINICAL IMPRESSION(S) / ED DIAGNOSES  Final diagnoses:  Syncope, unspecified syncope type  Knee contusion, left, initial encounter  Knee effusion, left      NEW MEDICATIONS STARTED DURING THIS VISIT:  There are no discharge medications for this patient.    Eula Listen, MD 04/10/16 2150

## 2016-04-10 NOTE — ED Notes (Signed)
X-ray at bedside

## 2016-04-10 NOTE — ED Notes (Signed)
Pt was in church and had witnessed syncopal episode. Pt drowsy on arrival to ED. Unsure why passed out. Does arouse to voice. Took 2 pills (at least one for pain) from purse. RN requested pt not take any medication at this time but pt took anyway. States " I didn't want to come".  States "i just had all those tests done".

## 2016-04-10 NOTE — ED Notes (Signed)
Patient transported to X-ray 

## 2016-04-12 LAB — URINE CULTURE

## 2016-06-24 ENCOUNTER — Emergency Department
Admission: EM | Admit: 2016-06-24 | Discharge: 2016-06-24 | Disposition: A | Discharge status: Home / Self Care | Payer: Medicare PPO | Attending: Emergency Medicine | Admitting: Emergency Medicine

## 2016-06-24 ENCOUNTER — Emergency Department: Payer: Medicare PPO

## 2016-06-24 ENCOUNTER — Encounter: Payer: Self-pay | Admitting: Emergency Medicine

## 2016-06-24 DIAGNOSIS — N390 Urinary tract infection, site not specified: Secondary | ICD-10-CM | POA: Diagnosis not present

## 2016-06-24 DIAGNOSIS — R109 Unspecified abdominal pain: Secondary | ICD-10-CM | POA: Diagnosis present

## 2016-06-24 DIAGNOSIS — N200 Calculus of kidney: Secondary | ICD-10-CM | POA: Diagnosis not present

## 2016-06-24 DIAGNOSIS — F17200 Nicotine dependence, unspecified, uncomplicated: Secondary | ICD-10-CM | POA: Insufficient documentation

## 2016-06-24 DIAGNOSIS — I10 Essential (primary) hypertension: Secondary | ICD-10-CM | POA: Diagnosis not present

## 2016-06-24 LAB — URINALYSIS COMPLETE WITH MICROSCOPIC (ARMC ONLY)
BILIRUBIN URINE: NEGATIVE
Bacteria, UA: NONE SEEN
Glucose, UA: NEGATIVE mg/dL
Hgb urine dipstick: NEGATIVE
KETONES UR: NEGATIVE mg/dL
NITRITE: NEGATIVE
PH: 5 (ref 5.0–8.0)
Protein, ur: NEGATIVE mg/dL
RBC / HPF: NONE SEEN RBC/hpf (ref 0–5)
SPECIFIC GRAVITY, URINE: 1.015 (ref 1.005–1.030)

## 2016-06-24 LAB — CBC WITH DIFFERENTIAL/PLATELET
Basophils Absolute: 0 10*3/uL (ref 0–0.1)
Basophils Relative: 1 %
EOS ABS: 0.1 10*3/uL (ref 0–0.7)
EOS PCT: 1 %
HCT: 38.4 % (ref 35.0–47.0)
Hemoglobin: 12.9 g/dL (ref 12.0–16.0)
LYMPHS ABS: 1.6 10*3/uL (ref 1.0–3.6)
LYMPHS PCT: 34 %
MCH: 27.6 pg (ref 26.0–34.0)
MCHC: 33.5 g/dL (ref 32.0–36.0)
MCV: 82.5 fL (ref 80.0–100.0)
MONO ABS: 0.6 10*3/uL (ref 0.2–0.9)
Monocytes Relative: 13 %
Neutro Abs: 2.4 10*3/uL (ref 1.4–6.5)
Neutrophils Relative %: 51 %
PLATELETS: 143 10*3/uL — AB (ref 150–440)
RBC: 4.65 MIL/uL (ref 3.80–5.20)
RDW: 15 % — AB (ref 11.5–14.5)
WBC: 4.7 10*3/uL (ref 3.6–11.0)

## 2016-06-24 LAB — COMPREHENSIVE METABOLIC PANEL
ALT: 19 U/L (ref 14–54)
ANION GAP: 4 — AB (ref 5–15)
AST: 26 U/L (ref 15–41)
Albumin: 3.6 g/dL (ref 3.5–5.0)
Alkaline Phosphatase: 79 U/L (ref 38–126)
BUN: 14 mg/dL (ref 6–20)
CHLORIDE: 103 mmol/L (ref 101–111)
CO2: 29 mmol/L (ref 22–32)
CREATININE: 0.54 mg/dL (ref 0.44–1.00)
Calcium: 8.8 mg/dL — ABNORMAL LOW (ref 8.9–10.3)
Glucose, Bld: 103 mg/dL — ABNORMAL HIGH (ref 65–99)
POTASSIUM: 4.1 mmol/L (ref 3.5–5.1)
SODIUM: 136 mmol/L (ref 135–145)
Total Bilirubin: 0.1 mg/dL — ABNORMAL LOW (ref 0.3–1.2)
Total Protein: 7 g/dL (ref 6.5–8.1)

## 2016-06-24 LAB — LIPASE, BLOOD: LIPASE: 15 U/L (ref 11–51)

## 2016-06-24 MED ORDER — PROMETHAZINE HCL 25 MG/ML IJ SOLN
25.0000 mg | Freq: Once | INTRAMUSCULAR | Status: AC
  Administered 2016-06-24: 25 mg via INTRAVENOUS
  Filled 2016-06-24: qty 1

## 2016-06-24 MED ORDER — ONDANSETRON HCL 4 MG/2ML IJ SOLN
4.0000 mg | Freq: Once | INTRAMUSCULAR | Status: AC
  Administered 2016-06-24: 4 mg via INTRAVENOUS
  Filled 2016-06-24: qty 2

## 2016-06-24 MED ORDER — CEPHALEXIN 500 MG PO CAPS
500.0000 mg | ORAL_CAPSULE | Freq: Three times a day (TID) | ORAL | 0 refills | Status: AC
Start: 2016-06-24 — End: 2016-07-04

## 2016-06-24 MED ORDER — LORAZEPAM 2 MG/ML IJ SOLN
1.0000 mg | Freq: Once | INTRAMUSCULAR | Status: AC
  Administered 2016-06-24: 1 mg via INTRAVENOUS
  Filled 2016-06-24: qty 1

## 2016-06-24 MED ORDER — HYDROMORPHONE HCL 1 MG/ML IJ SOLN
1.0000 mg | Freq: Once | INTRAMUSCULAR | Status: AC
  Administered 2016-06-24: 1 mg via INTRAVENOUS
  Filled 2016-06-24: qty 1

## 2016-06-24 MED ORDER — DEXTROSE 5 % IV SOLN
1.0000 g | Freq: Once | INTRAVENOUS | Status: AC
  Administered 2016-06-24: 1 g via INTRAVENOUS
  Filled 2016-06-24: qty 10

## 2016-06-24 MED ORDER — KETOROLAC TROMETHAMINE 30 MG/ML IJ SOLN
30.0000 mg | Freq: Once | INTRAMUSCULAR | Status: AC
  Administered 2016-06-24: 30 mg via INTRAVENOUS
  Filled 2016-06-24: qty 1

## 2016-06-24 MED ORDER — SODIUM CHLORIDE 0.9 % IV BOLUS (SEPSIS)
1000.0000 mL | Freq: Once | INTRAVENOUS | Status: AC
  Administered 2016-06-24: 1000 mL via INTRAVENOUS

## 2016-06-24 NOTE — ED Notes (Signed)
Pt c/o rt flank pain, states that it feels like something is stabbing her in her rt lung, pt more comfortable lying on her left side, pt states that she hasn't voided since last night, pt was able to give a urine sample and after started to have pain again, pt put out 500 ml's

## 2016-06-24 NOTE — ED Provider Notes (Signed)
-----------------------------------------   3:05 PM on 06/24/2016 -----------------------------------------   Blood pressure (!) 157/73, pulse 65, temperature 98.3 F (36.8 C), temperature source Oral, resp. rate 20, height 5\' 6"  (1.676 m), weight 113.4 kg, SpO2 (!) 89 %.  Assuming care from Dr. Darl Householder.  In short, Gail Willis is a 65 y.o. female with a chief complaint of Abdominal Pain .  Refer to the original H&P for additional details.  The current plan of care is to reassess after sedation wears off a bit.   ----------------------------------------- 5:56 PM on 06/24/2016 -----------------------------------------  Patient awake, alert, oriented, VSS, no hypoxemia.  Father is here to pick her up.  NAD at this time.   Hinda Kehr, MD 06/24/16 1757

## 2016-06-24 NOTE — ED Notes (Signed)
Reports pain in right side directly under axilla.  Denies n/v/d, urinary sx or dc.  Pt states she takes methadone and last took It last pm. Skin w/d.

## 2016-06-24 NOTE — ED Notes (Signed)
Pt cont to be sleepy, will arouse but goes back to sleep quickly without stimuli, cont to monitor

## 2016-06-24 NOTE — ED Triage Notes (Signed)
Pt arrived a&o via EMS.  Lying on stretcher moaning loadly.  Skin w/d.  Reports right side pain.  Denies n/v/d or other sx.

## 2016-06-24 NOTE — Discharge Instructions (Addendum)
Take keflex three times daily for a week.   Stay hydrated.   See your pain doctor/   Return to ER if you have worse flank pain, vomiting, fevers.

## 2016-06-24 NOTE — ED Notes (Signed)
Pt cont to be sleepy, will arouse with gentle shaking and name calling, will fall back to sleep without stimuli, cont to monitor

## 2016-06-24 NOTE — ED Provider Notes (Addendum)
[place] Provider Note   CSN: [id number] Arrival date & time: 06/24/16  1135     History   Chief Complaint Chief Complaint  Patient presents with  . Abdominal Pain    HPI Gail Willis is a 65 y.o. 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 Patient states that she has acute onset of right flank pain since yesterday. States that this is different than her usual back pain. Patient states that it is constant and sharp. Denies any radiation to the pain. States that she feels nauseated but did not vomit. Denies any fevers or chills. Denies chest pain She is on methadone and last dose was yesterday. Has pain doctor outpatient.   The history is provided by the patient.    Past Medical History:  Diagnosis Date  . Acid reflux   . Anxiety   . Diastolic dysfunction   . Fibromyalgia   . Hypertension   . Seizures ([place])     There are no active problems to display for this patient.   Past Surgical History:  Procedure Laterality Date  . HEMORRHOID SURGERY      OB History    No data available       Home Medications    Prior to Admission medications   Not on File    Family History No family history on file.  Social History Social History  Substance Use Topics  . Smoking status: Current Every Day Smoker  . Smokeless tobacco: Never Used  . Alcohol use No     Allergies   Sulfa antibiotics   Review of Systems Review of Systems  Gastrointestinal:       R flank pain   All other systems reviewed and are negative.    Physical Exam Updated Vital Signs BP (!) 157/73   Pulse 65   Temp 98.3 F (36.8 C) (Oral)   Resp 20   Ht 5\' 6"  (1.676 m)   Wt 250 lb (113.4 kg)   SpO2 (!) 89%   BMI 40.35 kg/m   Physical Exam  Constitutional: She is oriented to person, place, and time.  Uncomfortable, retching, tearful   HENT:  Head: Normocephalic.  Eyes: EOM are normal. Pupils are equal, round,  and reactive to light.  Neck: Normal range of motion. Neck supple.  Cardiovascular: Normal rate, regular rhythm and normal heart sounds.   Pulmonary/Chest: Effort normal and breath sounds normal. No respiratory distress. She has no wheezes. She has no rales.  Abdominal: Soft. Bowel sounds are normal.  + R CVAT   Musculoskeletal: Normal range of motion.  Neurological: She is alert and oriented to person, place, and time.  Skin: Skin is warm.  Psychiatric: She has a normal mood and affect.  Nursing note and vitals reviewed.    ED Treatments / Results  Labs (all labs ordered are listed, but only abnormal results are displayed) Labs Reviewed  CBC WITH DIFFERENTIAL/PLATELET - Abnormal; Notable for the following:       Result Value   RDW 15.0 (*)    Platelets 143 (*)    All other components within normal limits  COMPREHENSIVE METABOLIC PANEL - Abnormal; Notable for the following:    Glucose, Bld 103 (*)    Calcium 8.8 (*)    Total Bilirubin 0.1 (*)    Anion gap 4 (*)    All other components within normal limits  URINALYSIS COMPLETEWITH MICROSCOPIC ([facility] ONLY) - Abnormal; Notable for  the following:    Color, Urine YELLOW (*)    APPearance CLEAR (*)    Leukocytes, UA 1+ (*)    Squamous Epithelial / LPF 0-5 (*)    All other components within normal limits  URINE CULTURE  LIPASE, BLOOD    EKG  EKG Interpretation None       ED ECG REPORT I, Wandra Arthurs, the attending physician, personally viewed and interpreted this ECG.   Date: 06/24/2016  EKG Time: 11:33 am  Rate: 113  Rhythm: sinus tachycardia  Axis: normal  Intervals:none  ST&T Change: nonspecific    Radiology Ct Renal Stone Study  Result Date: 06/24/2016 CLINICAL DATA:  Follow right-sided flank pain today. EXAM: CT ABDOMEN AND PELVIS WITHOUT CONTRAST TECHNIQUE: Multidetector CT imaging of the abdomen and pelvis was performed following the standard protocol without IV contrast. COMPARISON:  None. FINDINGS: Lower  chest: The lung bases demonstrate a streaky bibasilar dependent subpleural atelectasis and left basilar scarring changes. The heart is mildly enlarged. No pericardial effusion. Scattered atherosclerotic calcifications involving the aorta. The distal esophagus is grossly normal. Hepatobiliary: Fairly advanced cirrhotic changes involving the liver with marked irregularity of the liver contour, dilated hepatic fissures and increased caudate to right lobe ratio. No focal hepatic lesions to suggest hepatic cellular carcinoma. Associated portal venous hypertension, portal venous collaterals but no ascites or splenomegaly. The gallbladder is normal. No common bile duct dilatation. Pancreas: No mass, inflammation or ductal dilatation.  Mild atrophy. Spleen: Normal size.  No focal lesions. Adrenals/Urinary Tract: The adrenal glands are normal. Mid and lower pole left renal calculi are noted but no obstructing ureteral calculi or bladder calculi. Tiny lower pole right renal calculus. No renal or bladder mass. Stomach/Bowel: The stomach, duodenum, small bowel and colon are grossly normal without oral contrast. No inflammatory changes, mass lesions or obstructive findings. The terminal ileum is normal. Moderate stool throughout the colon may suggest constipation. Vascular/Lymphatic: Advanced atherosclerotic calcifications involving the aorta and iliac arteries but no focal aneurysm. Small scattered mesenteric and retroperitoneal lymph nodes but no mass or overt adenopathy. Reproductive: Surgically absent. Other: No pelvic mass or adenopathy. No free pelvic fluid collections. No inguinal mass or adenopathy. No abdominal wall hernia or subcutaneous lesions. Musculoskeletal: No significant bony findings. Moderate degenerative changes involving the lower lumbar facets. IMPRESSION: 1. Bilateral renal calculi but no obstructing ureteral calculi or bladder calculi. No bladder or renal mass. 2. Advanced cirrhotic changes involving the  liver as detailed above. No worrisome hepatic lesions. 3. Advanced atherosclerotic calcifications involving the distal aorta and iliac arteries no aneurysm. 4. No acute abdominal/pelvic findings, mass lesions or adenopathy. Electronically Signed   By: Marijo Sanes M.D.   On: 06/24/2016 12:17    Procedures Procedures (including critical care time)  Medications Ordered in ED Medications  HYDROmorphone (DILAUDID) injection 1 mg (not administered)  LORazepam (ATIVAN) injection 1 mg (not administered)  ondansetron (ZOFRAN) injection 4 mg (not administered)  cefTRIAXone (ROCEPHIN) 1 g in dextrose 5 % 50 mL IVPB (not administered)  sodium chloride 0.9 % bolus 1,000 mL (1,000 mLs Intravenous New Bag/Given 06/24/16 1404)  ketorolac (TORADOL) 30 MG/ML injection 30 mg (30 mg Intravenous Given 06/24/16 1403)  promethazine (PHENERGAN) injection 25 mg (25 mg Intravenous Given 06/24/16 1403)     Initial Impression / Assessment and Plan / ED Course  I have reviewed the triage vital signs and the nursing notes.  Pertinent labs & imaging results that were available during my care of the patient were  reviewed by me and considered in my medical decision making (see chart for details).  Clinical Course   Emory SHANTY SICA is a 65 y.o. female here with R flank pain. Consider renal colic vs pyelo vs worsening chronic pain. She is retching and screaming in pain. Will give pain med, get labs, UA, CT renal stone.   2 pm Patient went to get urine sample and pain was worse. Started retching again. Will give toradol, phenergan.   3 pm Patient now sleepy. O2 dropped to 89% on RA. Placed on 2 L  and on pulse ox monitor. UA ? UTI. Given some intra renal stones in the kidney, I ordered rocephin empirically mainly to prevent infected stones in the future. Will likely dc home with keflex when she is awake. Told patient that she has pain doctor and we will NOT be prescribing pain meds to her. Dr. Karma Greaser in the ED aware.      Final Clinical Impressions(s) / ED Diagnoses   Final diagnoses:  None    New Prescriptions New Prescriptions   No medications on file     Drenda Freeze, MD 06/24/16 [place] Yao, MD 06/24/16 [id number]

## 2016-06-24 NOTE — ED Notes (Signed)
Pt's father here to take pt home, pt awake and talking with dad but cont to be sleepy, will begin to drift without conversation, room air sat of 95%

## 2016-06-26 LAB — URINE CULTURE: Culture: NO GROWTH

## 2017-12-18 ENCOUNTER — Other Ambulatory Visit: Payer: Self-pay

## 2017-12-18 ENCOUNTER — Emergency Department: Payer: Medicare PPO

## 2017-12-18 ENCOUNTER — Inpatient Hospital Stay
Admission: EM | Admit: 2017-12-18 | Discharge: 2017-12-20 | DRG: 948 | Disposition: A | Discharge status: Home Health | Payer: Medicare PPO | Attending: Family Medicine | Admitting: Family Medicine

## 2017-12-18 DIAGNOSIS — C220 Liver cell carcinoma: Secondary | ICD-10-CM | POA: Diagnosis present

## 2017-12-18 DIAGNOSIS — Z7189 Other specified counseling: Secondary | ICD-10-CM

## 2017-12-18 DIAGNOSIS — F419 Anxiety disorder, unspecified: Secondary | ICD-10-CM | POA: Diagnosis present

## 2017-12-18 DIAGNOSIS — I110 Hypertensive heart disease with heart failure: Secondary | ICD-10-CM | POA: Diagnosis present

## 2017-12-18 DIAGNOSIS — Z888 Allergy status to other drugs, medicaments and biological substances status: Secondary | ICD-10-CM

## 2017-12-18 DIAGNOSIS — Z6841 Body Mass Index (BMI) 40.0 and over, adult: Secondary | ICD-10-CM

## 2017-12-18 DIAGNOSIS — Z515 Encounter for palliative care: Secondary | ICD-10-CM

## 2017-12-18 DIAGNOSIS — C7951 Secondary malignant neoplasm of bone: Secondary | ICD-10-CM | POA: Diagnosis present

## 2017-12-18 DIAGNOSIS — G893 Neoplasm related pain (acute) (chronic): Secondary | ICD-10-CM | POA: Diagnosis not present

## 2017-12-18 DIAGNOSIS — R079 Chest pain, unspecified: Secondary | ICD-10-CM | POA: Diagnosis present

## 2017-12-18 DIAGNOSIS — R51 Headache: Secondary | ICD-10-CM | POA: Diagnosis present

## 2017-12-18 DIAGNOSIS — I5032 Chronic diastolic (congestive) heart failure: Secondary | ICD-10-CM | POA: Diagnosis present

## 2017-12-18 DIAGNOSIS — M797 Fibromyalgia: Secondary | ICD-10-CM | POA: Diagnosis present

## 2017-12-18 DIAGNOSIS — Z79899 Other long term (current) drug therapy: Secondary | ICD-10-CM

## 2017-12-18 DIAGNOSIS — R1010 Upper abdominal pain, unspecified: Secondary | ICD-10-CM

## 2017-12-18 DIAGNOSIS — Z882 Allergy status to sulfonamides status: Secondary | ICD-10-CM

## 2017-12-18 DIAGNOSIS — F17200 Nicotine dependence, unspecified, uncomplicated: Secondary | ICD-10-CM | POA: Diagnosis present

## 2017-12-18 DIAGNOSIS — Z66 Do not resuscitate: Secondary | ICD-10-CM | POA: Diagnosis present

## 2017-12-18 HISTORY — DX: Malignant (primary) neoplasm, unspecified: C80.1

## 2017-12-18 LAB — CBC
HEMATOCRIT: 38.5 % (ref 35.0–47.0)
Hemoglobin: 12.5 g/dL (ref 12.0–16.0)
MCH: 26.4 pg (ref 26.0–34.0)
MCHC: 32.6 g/dL (ref 32.0–36.0)
MCV: 81.1 fL (ref 80.0–100.0)
PLATELETS: 182 10*3/uL (ref 150–440)
RBC: 4.75 MIL/uL (ref 3.80–5.20)
RDW: 15 % — AB (ref 11.5–14.5)
WBC: 4.8 10*3/uL (ref 3.6–11.0)

## 2017-12-18 LAB — URINALYSIS, ROUTINE W REFLEX MICROSCOPIC
Bilirubin Urine: NEGATIVE
Glucose, UA: NEGATIVE mg/dL
Hgb urine dipstick: NEGATIVE
KETONES UR: NEGATIVE mg/dL
LEUKOCYTES UA: NEGATIVE
NITRITE: NEGATIVE
PROTEIN: NEGATIVE mg/dL
Specific Gravity, Urine: 1.004 — ABNORMAL LOW (ref 1.005–1.030)
pH: 7 (ref 5.0–8.0)

## 2017-12-18 LAB — COMPREHENSIVE METABOLIC PANEL
ALT: 95 U/L — ABNORMAL HIGH (ref 14–54)
ANION GAP: 9 (ref 5–15)
AST: 95 U/L — ABNORMAL HIGH (ref 15–41)
Albumin: 3.4 g/dL — ABNORMAL LOW (ref 3.5–5.0)
Alkaline Phosphatase: 122 U/L (ref 38–126)
BILIRUBIN TOTAL: 0.5 mg/dL (ref 0.3–1.2)
BUN: 10 mg/dL (ref 6–20)
CHLORIDE: 95 mmol/L — AB (ref 101–111)
CO2: 26 mmol/L (ref 22–32)
Calcium: 8.7 mg/dL — ABNORMAL LOW (ref 8.9–10.3)
Creatinine, Ser: 0.58 mg/dL (ref 0.44–1.00)
GFR calc Af Amer: >60 mL/min (ref >60)
Glucose, Bld: 131 mg/dL — ABNORMAL HIGH (ref 65–99)
POTASSIUM: 3.7 mmol/L (ref 3.5–5.1)
Sodium: 130 mmol/L — ABNORMAL LOW (ref 135–145)
TOTAL PROTEIN: 7.9 g/dL (ref 6.5–8.1)

## 2017-12-18 LAB — TROPONIN I

## 2017-12-18 LAB — LIPASE, BLOOD: Lipase: 28 U/L (ref 11–51)

## 2017-12-18 MED ORDER — ONDANSETRON HCL 4 MG/2ML IJ SOLN
INTRAMUSCULAR | Status: AC
  Administered 2017-12-18: 4 mg via INTRAVENOUS
  Filled 2017-12-18: qty 2

## 2017-12-18 MED ORDER — ONDANSETRON HCL 4 MG/2ML IJ SOLN
4.0000 mg | Freq: Once | INTRAMUSCULAR | Status: AC
  Administered 2017-12-18: 4 mg via INTRAVENOUS

## 2017-12-18 MED ORDER — HYDROMORPHONE HCL 1 MG/ML IJ SOLN
1.0000 mg | Freq: Once | INTRAMUSCULAR | Status: AC
  Administered 2017-12-18: 1 mg via INTRAVENOUS

## 2017-12-18 MED ORDER — IOPAMIDOL (ISOVUE-300) INJECTION 61%
30.0000 mL | Freq: Once | INTRAVENOUS | Status: AC
  Administered 2017-12-18: 30 mL via ORAL

## 2017-12-18 MED ORDER — HYDROMORPHONE HCL 1 MG/ML IJ SOLN
INTRAMUSCULAR | Status: AC
  Administered 2017-12-18: 1 mg via INTRAVENOUS
  Filled 2017-12-18: qty 1

## 2017-12-18 NOTE — ED Notes (Signed)
Pt crying and writhing in pain.  EDP notified and VO given for dilaudid 1mg  IV push and zofran 4mg  IV push.

## 2017-12-18 NOTE — ED Triage Notes (Signed)
Pt comes via [facility] from home with c/o chest pain, abdominal pain and back pain. 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 Pt is alert.

## 2017-12-18 NOTE — ED Provider Notes (Signed)
[facility] Emergency Department Provider Note  Time seen: 10:06 PM  I have reviewed the triage vital signs and the nursing notes.   HISTORY  Chief Complaint Chest Pain; Back Pain; and Abdominal Pain    HPI Gail Willis is a 67 y.o. female with a past medical history of anxiety, CHF, fibromyalgia, hypertension, seizure disorder, presents to the emergency department with chest pain, back pain and abdominal pain.  According to the patient she was diagnosed with liver cancer approximately 1 year ago with metastatic disease to her back.  Currently end-stage, not undergoing treatment.  Patient states increasing swelling of her abdomen now with abdominal pain and pressure over the past several weeks, over the past several days she has been experiencing chest pain in the lower chest and shortness of breath.  States back pain but states that has been chronic throughout her illness.  States the chest pain shortness of breath is what worsen bring her into the emergency department tonight.  Denies any fever, cough or congestion.  Denies dysuria, diarrhea, black or bloody stool.  States she has been nauseated with intermittent vomiting over the past several weeks but denies any today.   Past Medical History:  Diagnosis Date  . Acid reflux   . Anxiety   . Cancer ([place])   . Diastolic dysfunction   . Fibromyalgia   . Hypertension   . Seizures ([place])     There are no active problems to display for this patient.   Past Surgical History:  Procedure Laterality Date  . HEMORRHOID SURGERY      Prior to Admission medications   Not on File    Allergies  Allergen Reactions  . Sulfa Antibiotics Swelling    No family history on file.  Social History Social History   Tobacco Use  . Smoking status: Current Every Day Smoker  . Smokeless tobacco: Never Used  Substance Use Topics  . Alcohol use: No  . Drug use: Not on file    Review of Systems Constitutional:  Negative for fever. Eyes: Negative for visual complaints ENT: Negative for recent illness/congestion Cardiovascular: Positive for chest pain. Respiratory: Positive for shortness of breath. Gastrointestinal: Positive for abdominal distention and pressure.  Positive for intermittent nausea and vomiting.  Negative for diarrhea Genitourinary: Negative for urinary compaints Musculoskeletal: Negative for musculoskeletal complaints Skin: Negative for skin complaints  Neurological: Negative for headache All other ROS negative  ____________________________________________   PHYSICAL EXAM:  VITAL SIGNS: ED Triage Vitals  Enc Vitals Group     BP --      Pulse Rate 12/18/17 2203 88     Resp 12/18/17 2203 18     Temp 12/18/17 2203 98.1 F (36.7 C)     Temp Source 12/18/17 2203 Oral     SpO2 12/18/17 2203 96 %     Weight 12/18/17 2205 218 lb (98.9 kg)     Height 12/18/17 2205 5\' 2"  (1.575 m)     Head Circumference --      Peak Flow --      Pain Score 12/18/17 2203 10     Pain Loc --      Pain Edu? --      Excl. in Accident? --    Constitutional: Alert and oriented. Well appearing and in no distress. Eyes: Normal exam ENT   Head: Normocephalic and atraumatic.   Mouth/Throat: Mucous membranes are moist. Cardiovascular: Normal rate, regular rhythm. No murmur Respiratory: Normal respiratory effort without tachypnea  nor retractions. Breath sounds are clear  Gastrointestinal: But fairly significantly distended, dull percussion most consistent with ascites.  Mild diffuse tenderness but no focal tenderness, no significant tenderness, no rebound or guarding. Musculoskeletal: Nontender with normal range of motion in all extremities. No lower extremity tenderness  Neurologic:  Normal speech and language. No gross focal neurologic deficits  Skin:  Skin is warm, dry and intact.  Psychiatric: Mood and affect are normal.   ____________________________________________    EKG  EKG reviewed and  interpreted by myself shows normal sinus rhythm 89 bpm with a narrow QRS, normal axis, normal intervals with nonspecific ST changes but no ST elevation.  ____________________________________________    RADIOLOGY  Chest x-ray shows no active disease.  ____________________________________________   INITIAL IMPRESSION / ASSESSMENT AND PLAN / ED COURSE  Pertinent labs & imaging results that were available during my care of the patient were reviewed by me and considered in my medical decision making (see chart for details).  Patient presents to the emergency department for chest discomfort, shortness of breath.  Patient has a history of metastatic liver cancer, appears to have significant ascites during my examination.  Denies ever having a paracentesis performed previously.  Overall the patient appears well, I suspect her shortness of breath chest and abdominal discomfort are likely due to the patient's significant ascites.  We will check labs including cardiac enzymes, liver function tests and continue to closely monitor.  Patient agreeable to plan.  Patient care was signed out to oncoming physician.  Was ultimately admitted. ____________________________________________   FINAL CLINICAL IMPRESSION(S) / ED DIAGNOSES  Abdominal pain Abdominal distention Chest pain Shortness of breath    Harvest Dark, MD 12/22/17 2322

## 2017-12-19 ENCOUNTER — Encounter: Payer: Self-pay | Admitting: Radiology

## 2017-12-19 ENCOUNTER — Other Ambulatory Visit: Payer: Self-pay

## 2017-12-19 ENCOUNTER — Emergency Department: Payer: Medicare PPO

## 2017-12-19 DIAGNOSIS — Z7189 Other specified counseling: Secondary | ICD-10-CM | POA: Diagnosis not present

## 2017-12-19 DIAGNOSIS — M797 Fibromyalgia: Secondary | ICD-10-CM | POA: Diagnosis present

## 2017-12-19 DIAGNOSIS — C7951 Secondary malignant neoplasm of bone: Secondary | ICD-10-CM

## 2017-12-19 DIAGNOSIS — C220 Liver cell carcinoma: Secondary | ICD-10-CM | POA: Diagnosis present

## 2017-12-19 DIAGNOSIS — Z888 Allergy status to other drugs, medicaments and biological substances status: Secondary | ICD-10-CM | POA: Diagnosis not present

## 2017-12-19 DIAGNOSIS — Z515 Encounter for palliative care: Secondary | ICD-10-CM | POA: Diagnosis present

## 2017-12-19 DIAGNOSIS — I110 Hypertensive heart disease with heart failure: Secondary | ICD-10-CM | POA: Diagnosis present

## 2017-12-19 DIAGNOSIS — F419 Anxiety disorder, unspecified: Secondary | ICD-10-CM | POA: Diagnosis present

## 2017-12-19 DIAGNOSIS — I5032 Chronic diastolic (congestive) heart failure: Secondary | ICD-10-CM | POA: Diagnosis present

## 2017-12-19 DIAGNOSIS — Z6841 Body Mass Index (BMI) 40.0 and over, adult: Secondary | ICD-10-CM | POA: Diagnosis not present

## 2017-12-19 DIAGNOSIS — Z79899 Other long term (current) drug therapy: Secondary | ICD-10-CM | POA: Diagnosis not present

## 2017-12-19 DIAGNOSIS — Z882 Allergy status to sulfonamides status: Secondary | ICD-10-CM | POA: Diagnosis not present

## 2017-12-19 DIAGNOSIS — R079 Chest pain, unspecified: Secondary | ICD-10-CM | POA: Diagnosis present

## 2017-12-19 DIAGNOSIS — F17200 Nicotine dependence, unspecified, uncomplicated: Secondary | ICD-10-CM | POA: Diagnosis present

## 2017-12-19 DIAGNOSIS — Z66 Do not resuscitate: Secondary | ICD-10-CM | POA: Diagnosis present

## 2017-12-19 DIAGNOSIS — R51 Headache: Secondary | ICD-10-CM | POA: Diagnosis present

## 2017-12-19 DIAGNOSIS — G893 Neoplasm related pain (acute) (chronic): Secondary | ICD-10-CM | POA: Diagnosis present

## 2017-12-19 LAB — GLUCOSE, CAPILLARY: GLUCOSE-CAPILLARY: 258 mg/dL — AB (ref 65–99)

## 2017-12-19 MED ORDER — DEXAMETHASONE 4 MG PO TABS
4.0000 mg | ORAL_TABLET | Freq: Four times a day (QID) | ORAL | Status: AC
  Administered 2017-12-19: 4 mg via ORAL
  Filled 2017-12-19: qty 1

## 2017-12-19 MED ORDER — CHLORPROMAZINE HCL 25 MG PO TABS: 25.0000 mg | ORAL_TABLET | Freq: Four times a day (QID) | ORAL | Status: DC | PRN

## 2017-12-19 MED ORDER — LORAZEPAM 2 MG/ML IJ SOLN: 1.0000 mg | INTRAMUSCULAR | Status: DC | PRN

## 2017-12-19 MED ORDER — SODIUM CHLORIDE 0.9% FLUSH
3.0000 mL | Freq: Two times a day (BID) | INTRAVENOUS | Status: DC
  Administered 2017-12-19 – 2017-12-20 (×3): 3 mL via INTRAVENOUS

## 2017-12-19 MED ORDER — MORPHINE SULFATE (CONCENTRATE) 10 MG/0.5ML PO SOLN
5.0000 mg | ORAL | Status: DC | PRN
  Filled 2017-12-19: qty 1

## 2017-12-19 MED ORDER — LORAZEPAM 1 MG PO TABS: 1.0000 mg | ORAL_TABLET | ORAL | Status: DC | PRN

## 2017-12-19 MED ORDER — HYDROMORPHONE HCL 1 MG/ML IJ SOLN
INTRAMUSCULAR | Status: AC
  Filled 2017-12-19: qty 1

## 2017-12-19 MED ORDER — DIPHENHYDRAMINE HCL 50 MG/ML IJ SOLN: 12.5000 mg | INTRAMUSCULAR | Status: DC | PRN

## 2017-12-19 MED ORDER — HALOPERIDOL LACTATE 2 MG/ML PO CONC: 0.5000 mg | ORAL | Status: DC | PRN

## 2017-12-19 MED ORDER — ALUM & MAG HYDROXIDE-SIMETH 200-200-20 MG/5ML PO SUSP: 30.0000 mL | Freq: Four times a day (QID) | ORAL | Status: DC | PRN

## 2017-12-19 MED ORDER — MAGIC MOUTHWASH: 15.0000 mL | Freq: Four times a day (QID) | ORAL | Status: DC | PRN

## 2017-12-19 MED ORDER — MORPHINE SULFATE (CONCENTRATE) 10 MG/0.5ML PO SOLN: 5.0000 mg | ORAL | Status: DC | PRN

## 2017-12-19 MED ORDER — TOPIRAMATE 25 MG PO TABS
50.0000 mg | ORAL_TABLET | Freq: Once | ORAL | Status: AC
  Administered 2017-12-19: 50 mg via ORAL

## 2017-12-19 MED ORDER — BIOTENE DRY MOUTH MT LIQD: 15.0000 mL | OROMUCOSAL | Status: DC | PRN

## 2017-12-19 MED ORDER — SENNA 8.6 MG PO TABS: 1.0000 | ORAL_TABLET | Freq: Every evening | ORAL | Status: DC | PRN

## 2017-12-19 MED ORDER — CLONAZEPAM 0.5 MG PO TABS: 1.0000 mg | ORAL_TABLET | Freq: Every evening | ORAL | Status: DC | PRN

## 2017-12-19 MED ORDER — NYSTATIN 100000 UNIT/GM EX POWD
Freq: Three times a day (TID) | CUTANEOUS | Status: DC | PRN
  Filled 2017-12-19: qty 15

## 2017-12-19 MED ORDER — SODIUM CHLORIDE 0.9% FLUSH: 3.0000 mL | INTRAVENOUS | Status: DC | PRN

## 2017-12-19 MED ORDER — HYDROMORPHONE HCL 1 MG/ML IJ SOLN
1.0000 mg | Freq: Once | INTRAMUSCULAR | Status: AC
  Administered 2017-12-19: 1 mg via INTRAVENOUS
  Filled 2017-12-19: qty 1

## 2017-12-19 MED ORDER — HYDROMORPHONE HCL 1 MG/ML IJ SOLN
0.5000 mg | INTRAMUSCULAR | Status: DC | PRN
  Administered 2017-12-19 – 2017-12-20 (×5): 1 mg via INTRAVENOUS
  Filled 2017-12-19 (×6): qty 1

## 2017-12-19 MED ORDER — SODIUM CHLORIDE 0.9 % IV SOLN: 250.0000 mL | INTRAVENOUS | Status: DC | PRN

## 2017-12-19 MED ORDER — HYDROMORPHONE HCL 1 MG/ML IJ SOLN: 0.5000 mg | INTRAMUSCULAR | Status: DC | PRN

## 2017-12-19 MED ORDER — POLYETHYLENE GLYCOL 3350 17 G PO PACK: 17.0000 g | PACK | Freq: Every day | ORAL | Status: DC | PRN

## 2017-12-19 MED ORDER — ATROPINE SULFATE 1 % OP SOLN
4.0000 [drp] | OPHTHALMIC | Status: DC | PRN
  Filled 2017-12-19: qty 2

## 2017-12-19 MED ORDER — CLONAZEPAM 0.5 MG PO TABS
2.0000 mg | ORAL_TABLET | Freq: Two times a day (BID) | ORAL | Status: DC | PRN
  Administered 2017-12-19: 1 mg via ORAL
  Filled 2017-12-19: qty 4

## 2017-12-19 MED ORDER — HALOPERIDOL LACTATE 5 MG/ML IJ SOLN: 0.5000 mg | INTRAMUSCULAR | Status: DC | PRN

## 2017-12-19 MED ORDER — TOPIRAMATE 25 MG PO TABS
50.0000 mg | ORAL_TABLET | Freq: Every evening | ORAL | Status: DC | PRN
  Filled 2017-12-19: qty 2

## 2017-12-19 MED ORDER — IOPAMIDOL (ISOVUE-300) INJECTION 61%
100.0000 mL | Freq: Once | INTRAVENOUS | Status: AC | PRN
  Administered 2017-12-19: 100 mL via INTRAVENOUS

## 2017-12-19 MED ORDER — DEXAMETHASONE SODIUM PHOSPHATE 4 MG/ML IJ SOLN
4.0000 mg | Freq: Four times a day (QID) | INTRAMUSCULAR | Status: AC
  Administered 2017-12-19 (×3): 4 mg via INTRAVENOUS
  Filled 2017-12-19 (×3): qty 1

## 2017-12-19 MED ORDER — ONDANSETRON 4 MG PO TBDP: 4.0000 mg | ORAL_TABLET | Freq: Four times a day (QID) | ORAL | Status: DC | PRN

## 2017-12-19 MED ORDER — POLYVINYL ALCOHOL 1.4 % OP SOLN
1.0000 [drp] | Freq: Four times a day (QID) | OPHTHALMIC | Status: DC | PRN
  Filled 2017-12-19: qty 15

## 2017-12-19 MED ORDER — HALOPERIDOL 0.5 MG PO TABS: 0.5000 mg | ORAL_TABLET | ORAL | Status: DC | PRN

## 2017-12-19 MED ORDER — FENTANYL 25 MCG/HR TD PT72
25.0000 ug | MEDICATED_PATCH | TRANSDERMAL | Status: DC
  Administered 2017-12-19: 25 ug via TRANSDERMAL
  Filled 2017-12-19: qty 1

## 2017-12-19 MED ORDER — BISACODYL 10 MG RE SUPP: 10.0000 mg | Freq: Every day | RECTAL | Status: DC | PRN

## 2017-12-19 MED ORDER — TEMAZEPAM 15 MG PO CAPS: 15.0000 mg | ORAL_CAPSULE | Freq: Every day | ORAL | Status: DC

## 2017-12-19 MED ORDER — HYDROMORPHONE HCL 1 MG/ML IJ SOLN
1.0000 mg | Freq: Once | INTRAMUSCULAR | Status: AC
  Administered 2017-12-19: 1 mg via INTRAVENOUS

## 2017-12-19 MED ORDER — ONDANSETRON HCL 4 MG/2ML IJ SOLN: 4.0000 mg | Freq: Four times a day (QID) | INTRAMUSCULAR | Status: DC | PRN

## 2017-12-19 MED ORDER — LORAZEPAM 2 MG/ML PO CONC: 1.0000 mg | ORAL | Status: DC | PRN

## 2017-12-19 MED ORDER — OXYBUTYNIN CHLORIDE 5 MG PO TABS
2.5000 mg | ORAL_TABLET | Freq: Four times a day (QID) | ORAL | Status: DC | PRN
Start: 2017-12-19 — End: 2017-12-20

## 2017-12-19 MED ORDER — SENNA 8.6 MG PO TABS
1.0000 | ORAL_TABLET | Freq: Two times a day (BID) | ORAL | Status: DC
  Administered 2017-12-19 – 2017-12-20 (×2): 8.6 mg via ORAL
  Filled 2017-12-19 (×3): qty 1

## 2017-12-19 MED ORDER — LACTULOSE 10 GM/15ML PO SOLN
30.0000 g | Freq: Once | ORAL | Status: AC
  Administered 2017-12-19: 30 g via ORAL
  Filled 2017-12-19: qty 60

## 2017-12-19 NOTE — Plan of Care (Signed)
Pt won't take morphine - states it makes her itch and causes her tongue to swell.  She won't give a pain # says she doesn't want to complain. But she's grimacing, moaning and praying.  Shows me on her abd what is hurting - Texted Dr. Jerelyn Charles requesting dilaudid and he states he's going to come see her.

## 2017-12-19 NOTE — ED Notes (Signed)
Assisted pt to the restroom. Pt back in bed resting. No other needs voiced at this time.

## 2017-12-19 NOTE — Progress Notes (Signed)
   12/19/17 0800  Clinical Encounter Type  Visited With Patient  Visit Type Initial  Referral From Nurse  Consult/Referral To Chaplain  Spiritual Encounters  Spiritual Needs Prayer;Emotional;Grief support   CH received a PG from nurse requesting that Gail Willis come to PT's RM to console PT who was emotional over pain and diagnosis. PT has liver and bone cancer. [place] prayed with PT and will request another [place] follow up with PT.

## 2017-12-19 NOTE — ED Notes (Signed)
Patient transported to CT at this time. 

## 2017-12-19 NOTE — H&P (Signed)
[facility] NAME: Gail Willis    MR#:  [id number]  DATE OF BIRTH:  01/03/1951  DATE OF ADMISSION:  12/18/2017  PRIMARY CARE PHYSICIAN: Delena Serve, MD   REQUESTING/REFERRING PHYSICIAN:   CHIEF COMPLAINT:   Chief Complaint  Patient presents with  . Chest Pain  . Back Pain  . Abdominal Pain    HISTORY OF PRESENT ILLNESS: Gail Willis  is a 68 y.o. 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  PAST MEDICAL HISTORY:   Past Medical History:  Diagnosis Date  . Acid reflux   . Anxiety   . Cancer ([place])   . Diastolic dysfunction   . Fibromyalgia   . Hypertension   . Seizures ([facility])     PAST SURGICAL HISTORY:  Past Surgical History:  Procedure Laterality Date  . HEMORRHOID SURGERY      SOCIAL HISTORY:  Social History   Tobacco Use  . Smoking status: Current Every Day Smoker  . Smokeless tobacco: Never Used  Substance Use Topics  . Alcohol use: No    FAMILY HISTORY: NKDA  DRUG ALLERGIES:  Allergies  Allergen Reactions  . Citalopram Swelling  . Sulfasalazine Swelling  . Statins Other (See Comments)    Active HepC,   . Sulfa Antibiotics Swelling  . Gadolinium Derivatives Nausea And Vomiting    REVIEW OF SYSTEMS:   CONSTITUTIONAL: No fever,+ fatigue, weakness.  EYES: No blurred or double vision.  EARS, NOSE, AND THROAT: No tinnitus  or ear pain.  RESPIRATORY: No cough, shortness of breath, wheezing or hemoptysis.  CARDIOVASCULAR: No chest pain, orthopnea, edema.  GASTROINTESTINAL: + nausea,no vomiting, diarrhea,+ abdominal pain.  GENITOURINARY: No dysuria, hematuria.  ENDOCRINE: No polyuria, nocturia,  HEMATOLOGY: No anemia, easy bruising or bleeding SKIN: No rash or lesion. MUSCULOSKELETAL: Back pain, flank pain    NEUROLOGIC: No tingling, numbness, weakness.  PSYCHIATRY: No anxiety or depression.   MEDICATIONS AT HOME:  Prior to Admission medications   Medication Sig Start Date End Date Taking? Authorizing Provider  Fluticasone-Salmeterol (ADVAIR DISKUS) 250-50 MCG/DOSE AEPB Inhale 1 puff into the lungs 2 (two) times daily. 11/02/17 10/28/18 Yes [provider]  ipratropium (ATROVENT HFA) 17 MCG/ACT inhaler Inhale 2 puffs into the lungs every 6 (six) hours as needed.    [provider]      PHYSICAL EXAMINATION:   VITAL SIGNS: Blood pressure (!) 182/100, pulse 76, temperature 98.1 F (36.7 C), temperature source Oral, resp. rate 18, height 5\' 2"  (1.575 m), weight 98.9 kg (218 lb), SpO2 99 %.  GENERAL:  67 y.o.-year-old patient lying in the bed with severe acute distress.  Morbid obesity EYES: Pupils equal, round, reactive to light and accommodation. No scleral icterus. Extraocular muscles intact.  HEENT: Head atraumatic, normocephalic. Oropharynx and nasopharynx clear.  NECK:  Supple, no jugular venous distention. No thyroid enlargement, no tenderness.  LUNGS: Normal breath sounds bilaterally, no wheezing, rales,rhonchi or crepitation. No use of accessory muscles  of respiration.  CARDIOVASCULAR: S1, S2 normal. No murmurs, rubs, or gallops.  ABDOMEN: Soft, right upper quadrant tenderness, nondistended. Bowel sounds present. No organomegaly or mass.  EXTREMITIES: No pedal edema, cyanosis, or clubbing.  NEUROLOGIC: Cranial nerves II through XII are intact. MAES. Gait not checked.  PSYCHIATRIC:  The patient is alert and oriented x 3.  Emotional lability, anxious SKIN: No obvious rash, lesion, or ulcer.   LABORATORY PANEL:   CBC Recent Labs  Lab 12/18/17 2201  WBC 4.8  HGB 12.5  HCT 38.5  PLT 182  MCV 81.1  MCH 26.4  MCHC 32.6  RDW 15.0*   ------------------------------------------------------------------------------------------------------------------  Chemistries  Recent Labs  Lab 12/18/17 2201  NA 130*  K 3.7  CL 95*  CO2 26  GLUCOSE 131*  BUN 10  CREATININE 0.58  CALCIUM 8.7*  AST 95*  ALT 95*  ALKPHOS 122  BILITOT 0.5   ------------------------------------------------------------------------------------------------------------------ estimated creatinine clearance is 77 mL/min (by C-G formula based on SCr of 0.58 mg/dL). ------------------------------------------------------------------------------------------------------------------ No results for input(s): TSH, T4TOTAL, T3FREE, THYROIDAB in the last 72 hours.  Invalid input(s): FREET3   Coagulation profile No results for input(s): INR, PROTIME in the last 168 hours. ------------------------------------------------------------------------------------------------------------------- No results for input(s): DDIMER in the last 72 hours. -------------------------------------------------------------------------------------------------------------------  Cardiac Enzymes Recent Labs  Lab 12/18/17 2201  TROPONINI <0.03   ------------------------------------------------------------------------------------------------------------------ Invalid input(s): POCBNP  ---------------------------------------------------------------------------------------------------------------  Urinalysis    Component Value Date/Time   COLORURINE STRAW (A) 12/18/2017 2319   APPEARANCEUR CLEAR (A) 12/18/2017 2319   APPEARANCEUR Hazy 08/30/2012 1611   LABSPEC 1.004 (L) 12/18/2017 2319   LABSPEC 1.023 08/30/2012 1611    PHURINE 7.0 12/18/2017 2319   GLUCOSEU NEGATIVE 12/18/2017 2319   GLUCOSEU Negative 08/30/2012 1611   HGBUR NEGATIVE 12/18/2017 2319   BILIRUBINUR NEGATIVE 12/18/2017 2319   BILIRUBINUR Negative 08/30/2012 1611   KETONESUR NEGATIVE 12/18/2017 2319   PROTEINUR NEGATIVE 12/18/2017 2319   NITRITE NEGATIVE 12/18/2017 2319   LEUKOCYTESUR NEGATIVE 12/18/2017 2319   LEUKOCYTESUR 3+ 08/30/2012 1611     RADIOLOGY: Dg Chest 2 View  Result Date: 12/18/2017 CLINICAL DATA:  Chest pain, abdominal pain and back pain EXAM: CHEST - 2 VIEW COMPARISON:  04/10/2016 FINDINGS: The heart size and mediastinal contours are within normal limits. Mild aortic atherosclerosis at the arch. No aneurysm. No pulmonary consolidation or CHF. No effusion or pneumothorax. Stable degenerative change along the included cervical and thoracic spine. IMPRESSION: No active cardiopulmonary disease.  Minimal aortic atherosclerosis. Electronically Signed   By: Ashley Royalty M.D.   On: 12/18/2017 22:26   Ct Abdomen Pelvis W Contrast  Result Date: 12/19/2017 CLINICAL DATA:  Chest and abdominal pain as well as back pain. Stage IV in liver and back. EXAM: CT ABDOMEN AND PELVIS WITH CONTRAST TECHNIQUE: Multidetector CT imaging of the abdomen and pelvis was performed using the standard protocol following bolus administration of intravenous contrast. CONTRAST:  172mL ISOVUE-300 IOPAMIDOL (ISOVUE-300) INJECTION 61% COMPARISON:  06/24/2016 FINDINGS: Lower chest: Stable cardiomegaly. No pericardial effusion. Atelectasis and/or scarring is seen medially within the right lower lobe. No pleural effusion, pneumothorax or pulmonary mass is identified at the lung bases. There is a small hiatal hernia. Para-aortic lymphadenopathy is identified with central areas of hypodensity suggesting necrosis, the largest is at the diaphragmatic hiatus, to the right of the descending thoracic aorta measuring 2.4 cm in short axis. Smaller 11 mm or less short axis lymph  nodes are seen just cephalad to this finding. Hepatobiliary: Morphologic appearance of cirrhosis. Hypodense mass  in the right hepatic lobe measuring 2.4 x 2.5 x 3.1 cm is noted compatible with hepatocellular carcinoma the setting of cirrhosis. Within the porta hepatis, there is an ill-defined 5.6 x 7.2 x 8.3 cm masslike abnormality involving the main portal vein without occlusion as well as the common bile duct. It also appears to involve the pancreatic head and uncinate process. Findings may represent localized extension of tumor into porta hepatic lymph nodes and pancreas. Pancreas: Abnormal masslike enlargement with central areas of necrosis involving pancreatic head and uncinate process suspicious for localized extension of tumor from the liver or possibly representing pancreatic mass with adjacent peripancreatic adenopathy. Given cirrhotic appearance of the liver and additional hypodense masslike abnormality within the right hepatic lobe, findings are felt to represent localized extension from the liver to adjacent porta hepatic nodes and adjacent pancreas. Negative pancreatic ductal dilatation. Spleen: No splenic mass or splenomegaly. Adrenals/Urinary Tract: Normal bilateral adrenal glands. Symmetric cortical enhancement of both kidneys redemonstration of nonobstructing left-sided renal calculi, the largest is approximately 12 mm in the lower. No hydroureteronephrosis. Decompressed urinary bladder. Stomach/Bowel: Small hiatal hernia. Decompressed stomach. The duodenum is seen just lateral to the previously described right upper quadrant masslike abnormality involving porta hepatis and pancreas. No bowel obstruction or inflammation. Large amount of stool is seen within the colon. Vascular/Lymphatic: Adenopathy at the diaphragmatic hiatus as above described. Additionally there is a 2.7 cm short axis lymph node adjacent to the previously described porta hepatic mass. Aortoiliac atherosclerosis without aneurysm.  Epidural venous engorgement within the spinal canal likely from more upstream vascular obstruction possibly due to adenopathy at the diaphragmatic hiatus. Reproductive: Status post hysterectomy. No adnexal masses. Other: No abdominal wall hernia or abnormality. No abdominopelvic ascites. Musculoskeletal: No acute osseous abnormality. Multi level degenerative facet arthropathy of the lumbar spine. Stable sclerotic density in the right acetabulum. No aggressive osseous lesions. IMPRESSION: 1. Hepatic cirrhosis with new ill-defined right hepatic 2.4 x 2.5 x 3.1 cm mass consistent with hepatocellular carcinoma in the setting of cirrhosis. 2. New porta hepatic partially necrotic appearing mass enveloping the main portal vein, common bile duct and portions of the pancreatic head and uncinate process. Findings raise concern for localized extension of hepatic tumor to porta hepatic nodes and pancreas and would be keeping with stage IV hepatic cancer. 3. New enlarged para-aortic adenopathy at the diaphragmatic hiatus as well as aortocaval adenopathy adjacent to the porta hepatic mass. 4. Constipation. Electronically Signed   By: Ashley Royalty M.D.   On: 12/19/2017 02:20    EKG: Orders placed or performed during the hospital encounter of 04/10/16  . ED EKG  . ED EKG  . EKG 12-Lead  . EKG 12-Lead  . 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 With associated cancer pain Admit to regular nursing floor bed on our comfort care protocol, palliative care consulted, fentanyl patch for now, Restoril for sleep  2 acute headache Topamax now on as needed twice daily  3 acute on chronic anxiety disorder, unspecified Ativan as needed, Restoril for sleep  4 chronic morbid obesity Stable Most likely secondary to excess calories  DNR status Condition guarded Prognosis terminal DVT prophylaxis-none given comfort care status Disposition to home  with hospice versus hospice haven/house versus nursing home with hospice  All the records are reviewed and case discussed with ED provider. Management plans discussed with the patient, family and they are in agreement.  CODE STATUS:dnr Code Status History  This patient does not have a recorded code status. Please follow your organizational policy for patients in this situation.       TOTAL TIME TAKING CARE OF THIS PATIENT: 45 minutes critical care time was used to discuss plan of care with ED attending, ED staff, the patient, the patient's multiple family members with all questions answered.    Avel Peace Salary M.D on 12/19/2017   Between 7am to 6pm - Pager - [phone]  After 6pm go to [email or web address] - password [email or web address] [facility]  Office  [phone]  CC: Primary care physician; Delena Serve, MD   Note: This dictation was prepared with Dragon dictation along with smaller phrase technology. Any transcriptional errors that result from this process are unintentional.

## 2017-12-19 NOTE — ED Provider Notes (Signed)
-----------------------------------------   3:39 AM on 12/19/2017 -----------------------------------------   Blood pressure (!) 173/71, pulse 83, temperature 98.1 F (36.7 C), temperature source Oral, resp. rate 20, height 5\' 2"  (1.575 m), weight 98.9 kg (218 lb), SpO2 97 %.  Assuming care from Dr. Kerman Passey.  In short, Gail Willis is a 67 y.o. female with a chief complaint of Chest Pain; Back Pain; and Abdominal Pain .  Refer to the original H&P for additional details.  The current plan of care is to follow up the results of the CT scan and disposition the patient.  CT abd and pelvis: Hepatic cirrhosis with new ill-defined right hepatic 2.4 x 2.5 x 3.1 cm mass consistent with hepatocellular carcinoma, new porta hepatic partially necrotic appearing mass enveloping the main portal vein findings raise concern for extension of hepatic tumor, new enlarged para-aortic adenopathy, constipation.  The patient did receive 2 more doses of Zofran for pain.  I did tell her that she does have a possible new mass in her liver with a concern for spread.  The patient has been seen at [facility] for her cancer.  Given her continued pain I will admit the patient to the hospitalist service for pain control and further evaluation.        Loney Hering, MD 12/19/17 [phone]

## 2017-12-19 NOTE — Progress Notes (Signed)
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

## 2017-12-19 NOTE — Consult Note (Addendum)
Consultation Note Date: 12/19/2017   Patient Name: Gail Willis  DOB: 1951/09/14  MRN: [id number]  Age / Sex: 67 y.o., female  PCP: Delena Serve, MD Referring Physician: Gorden Harms, MD  Reason for Consultation: Hospice Evaluation  HPI/Patient Profile: 67 y.o. female  with past medical history of stage IV hepatocellular cancer, diastolic CHF, HTN, seizures, anxiety, fibromyalgia, acid reflux admitted on 12/18/2017 with worsening chest, back, and abd pain.   Clinical Assessment and Goals of Care: I met today with Gail Willis who is moaning and just seems miserable. She is complaining of pain behind knees and right neck and crying out in prayer. Very difficult to get any information regarding her pain or complaints. I asked her what she takes at home for pain and she tells me that she takes methadone. Unfortunately I am unsure how long it has been since her last dose of methadone and her fentanyl patch was just placed this morning and will take ~12-24 hours to take effect. She says she cannot take morphine because this makes her tongue swell - I have d/c and added to allergies.   Late entry: I spoke with daughter, Gail Willis, who has just arrived from [place], [place] to be with her mother. Gail Willis shares that her mother's biggest goal is to remain home and as independent as possible. This independence was the reason she decided to forego further treatment for her cancer d/t risk of decline and loss of independence considering the low possibility of benefit. After discussion with Dr. Jerelyn Charles we will continue fentanyl patch and reassess pain control tomorrow. Will need something for breakthrough pain (which I believe was the problem with her methadone because she had nothing for breakthrough pain).   We discussed palliative care vs hospice at home. Gail Willis is sleeping and unable to participate in the conversation. Gail Willis at  bedside says that Gail Willis was waiting for her daughter to decide on hospice. Gail Willis says that her mother has been very afraid of hospice up to this point. After further discussion regarding hospice care at home Gail Willis says that her mother may be more open but this will need to be further discussed. Tosha agrees that hospice would be helpful for her mother. Gail Willis is also concerned with change in pain medication causing sedation and not allowing for that previous independence.   Emotional support provided. I will follow up tomorrow and discuss more with Gail Willis and Gail Willis.   Primary Decision Maker PATIENT    SUMMARY OF RECOMMENDATIONS   - Will continue hospice discussion  Code Status/Advance Care Planning:  DNR - did not discuss   Symptom Management:   Per Dr. Jerelyn Charles: Fentanyl patch 25 mcg/hr begin this morning.  Change morphine to dilaudid 0.5-1 mg IV every 2 hours breakthrough pain. Recommend to transition to dilaudid po for breakthrough pain at home.   Considered gabapentin but she had prescription and was not taking at home (unsure why). May consider Lidoderm patch. Continue decadron.   Lactulose x 1 for BM (last BM  Saturday?). Was supposed to take Lactulose daily at home but family doubts she was compliant and often has constipation. Continue Senokot.   Palliative Prophylaxis:   Bowel Regimen, Delirium Protocol, Frequent Pain Assessment and Turn Reposition  Additional Recommendations (Limitations, Scope, Preferences):  No Chemotherapy  Psycho-social/Spiritual:   Desire for further Chaplaincy support:yes  Additional Recommendations: Caregiving  Support/Resources, Education on Hospice and Grief/Bereavement Support  Prognosis:   < 6 months  Discharge Planning: To Be Determined      Primary Diagnoses: Present on Admission: . Hepatocellular carcinoma metastatic to bone [facility])   I have reviewed the medical record, interviewed the patient and family, and examined the  patient. The following aspects are pertinent.  Past Medical History:  Diagnosis Date  . Acid reflux   . Anxiety   . Cancer ([place])   . Diastolic dysfunction   . Fibromyalgia   . Hypertension   . Seizures ([place])    Social History   Socioeconomic History  . Marital status: Widowed    Spouse name: None  . Number of children: None  . Years of education: None  . Highest education level: None  Social Needs  . Financial resource strain: Patient refused  . Food insecurity - worry: Patient refused  . Food insecurity - inability: Patient refused  . Transportation needs - medical: Patient refused  . Transportation needs - non-medical: Patient refused  Occupational History  . None  Tobacco Use  . Smoking status: Former Research scientist (life sciences)  . Smokeless tobacco: Never Used  Substance and Sexual Activity  . Alcohol use: No  . Drug use: No  . Sexual activity: None  Other Topics Concern  . None  Social History Narrative  . None   History reviewed. No pertinent family history. Scheduled Meds: . dexamethasone  4 mg Oral Q6H   Or  . dexamethasone  4 mg Intravenous Q6H  . fentaNYL  25 mcg Transdermal Q72H  . senna  1 tablet Oral BID  . sodium chloride flush  3 mL Intravenous Q12H  . temazepam  15 mg Oral QHS   Continuous Infusions: . sodium chloride     PRN Meds:.sodium chloride, alum & mag hydroxide-simeth, antiseptic oral rinse, atropine, bisacodyl, chlorproMAZINE, clonazePAM, diphenhydrAMINE, haloperidol **OR** haloperidol **OR** haloperidol lactate, LORazepam, magic mouthwash, nystatin, ondansetron **OR** ondansetron (ZOFRAN) IV, oxybutynin, polyethylene glycol, polyvinyl alcohol, senna, sodium chloride flush, topiramate Allergies  Allergen Reactions  . Citalopram Swelling  . Morphine And Related     Tongue swelling  . Sulfasalazine Swelling  . Statins Other (See Comments)    Active HepC,   . Sulfa Antibiotics Swelling  . Gadolinium Derivatives Nausea And Vomiting   Review of Systems    Constitutional: Positive for activity change, appetite change and fatigue.  Gastrointestinal: Positive for abdominal pain and constipation.  Neurological: Positive for weakness.    Physical Exam  Constitutional: She is oriented to person, place, and time. She appears well-developed. She appears ill.  HENT:  Head: Normocephalic and atraumatic.  Cardiovascular: Tachycardia present.  Pulmonary/Chest: Effort normal. No accessory muscle usage. No tachypnea. No respiratory distress.  Abdominal: Normal appearance.  Neurological: She is alert and oriented to person, place, and time.  Nursing note and vitals reviewed.   Vital Signs: BP (!) 164/63 (BP Location: Right Arm)   Pulse 65   Temp 98.2 F (36.8 C) (Oral)   Resp 19   Ht _0  (1.499 m)   Wt 102.7 kg (226 lb 6.4 oz)   SpO2 94%   BMI  45.73 kg/m  Pain Assessment: PAINAD(she won't give pain # - doesn't want to complain)   Pain Score: 10-Worst pain ever   SpO2: SpO2: 94 % O2 Device:SpO2: 94 % O2 Flow Rate: .   IO: Intake/output summary:   Intake/Output Summary (Last 24 hours) at 12/19/2017 1026 Last data filed at 12/19/2017 0900 Gross per 24 hour  Intake 120 ml  Output -  Net 120 ml    LBM: Last BM Date: 12/16/17 Baseline Weight: Weight: 98.9 kg (218 lb) Most recent weight: Weight: 102.7 kg (226 lb 6.4 oz)     Palliative Assessment/Data: 50%     Time Total: 90 min  Greater than 50%  of this time was spent counseling and coordinating care related to the above assessment and plan.  Signed by: Vinie Sill, NP Palliative Medicine Team Pager # [phone] (M-F 8a-5p) Team Phone # [phone] (Nights/Weekends)

## 2017-12-20 DIAGNOSIS — C7951 Secondary malignant neoplasm of bone: Secondary | ICD-10-CM

## 2017-12-20 LAB — GLUCOSE, CAPILLARY: GLUCOSE-CAPILLARY: 157 mg/dL — AB (ref 65–99)

## 2017-12-20 MED ORDER — SENNA 8.6 MG PO TABS: 1.0000 | ORAL_TABLET | Freq: Two times a day (BID) | ORAL | 0 refills | Status: AC

## 2017-12-20 MED ORDER — MEPERIDINE HCL 50 MG PO TABS: 50.0000 mg | ORAL_TABLET | ORAL | 0 refills | Status: AC | PRN

## 2017-12-20 MED ORDER — FENTANYL 25 MCG/HR TD PT72: 25.0000 ug | MEDICATED_PATCH | TRANSDERMAL | 0 refills | Status: AC

## 2017-12-20 MED ORDER — TEMAZEPAM 30 MG PO CAPS: 15.0000 mg | ORAL_CAPSULE | Freq: Every day | ORAL | 0 refills | Status: AC

## 2017-12-20 MED ORDER — TOPIRAMATE 50 MG PO TABS
50.0000 mg | ORAL_TABLET | Freq: Every evening | ORAL | 0 refills | Status: AC | PRN
Start: 2017-12-20 — End: ?

## 2017-12-20 MED ORDER — BISACODYL 10 MG RE SUPP
10.0000 mg | Freq: Once | RECTAL | Status: DC
  Filled 2017-12-20: qty 1

## 2017-12-20 NOTE — Progress Notes (Signed)
Discharge instructions given and went over with patient and daughter at bedside. Prescriptions given and reviewed. Follow-up appointment reviewed. All questions answered. Patient discharged home with family via wheelchair by volunteer services. Madlyn Frankel, RN

## 2017-12-20 NOTE — Care Management (Signed)
[facility] unable to accept patients insurance. TC to daughter, Candace Gallus. Lm regarding [facility]. [facility] able to accept patient. Referral to [place] with [facility] for RN, SW and HHA

## 2017-12-20 NOTE — Discharge Summary (Signed)
[facility] NAME: Gail Willis    MR#:  [id number]  DATE OF BIRTH:  05-27-1951  DATE OF ADMISSION:  12/18/2017 ADMITTING PHYSICIAN: Gorden Harms, MD  DATE OF DISCHARGE: No discharge date for patient encounter.  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 Acid reflux   . Anxiety   . Cancer ([place])   . Diastolic dysfunction   . Fibromyalgia   . Hypertension   . Seizures [facility])     HOSPITAL COURSE:   Signed           1acute on chronic incurable/inoperable metastatic hepatocellular carcinoma to bone With associated cancer pain Admitted on our comfort care protocol, palliative care consulted, placed on fentanyl patch, Restoril for sleep, patient preferred to have Demerol for breakthrough pain and wished for continuation of her methadone at home, patient is agreeable for home with hospice as pain is much improved, patient follow-up with primary care provider in 2-3 days if needed  2acute headache Resolved on Topamax  3acute on chronic anxiety disorder, unspecified Stable on current regiment  4chronic morbid obesity Stable Most likely secondary to excess calories  DNR status Condition guarded Prognosis terminal Disposition to home with hospice            DISCHARGE CONDITIONS:   On the day of discharge patient's condition is guarded, to home with hospice, follow with primary care provider if needed, for more specific details please see chart  CONSULTS OBTAINED:    DRUG ALLERGIES:   Allergies  Allergen Reactions  . Citalopram  Swelling  . Morphine And Related     Tongue swelling  . Sulfasalazine Swelling  . Statins Other (See Comments)    Active HepC,   . Sulfa Antibiotics Swelling  . Gadolinium Derivatives Nausea And Vomiting    DISCHARGE MEDICATIONS:   Allergies as of 12/20/2017      Reactions   Citalopram Swelling   Morphine And Related    Tongue swelling   Sulfasalazine Swelling   Statins Other (See Comments)   Active HepC,    Sulfa Antibiotics Swelling   Gadolinium Derivatives Nausea And Vomiting      Medication List    TAKE these medications   ADVAIR DISKUS 250-50 MCG/DOSE Aepb Generic drug:  Fluticasone-Salmeterol Inhale 1 puff into the lungs 2 (two) times daily.   clonazePAM 2 MG tablet Commonly known as:  KLONOPIN Take 2 mg by mouth 2 (two) times daily as needed for anxiety.   diclofenac sodium 1 % Gel Commonly known as:  VOLTAREN Apply 4 g topically 4 (four) times daily.   fentaNYL 25 MCG/HR patch Commonly known as:  DURAGESIC - dosed mcg/hr Place 1 patch (25 mcg total) onto the skin every 3 (three) days. Start taking on:  12/22/2017   furosemide 40 MG tablet Commonly known as:  LASIX Take 40 mg by mouth 2 (two) times daily.   glucosamine-chondroitin 500-400 MG tablet Take 1 tablet by mouth as directed.   hydrochlorothiazide 25 MG tablet Commonly known as:  HYDRODIURIL Take 25 mg by mouth daily.   ipratropium 17 MCG/ACT inhaler Commonly known as:  ATROVENT HFA Inhale 2 puffs into the lungs every 6 (six) hours as needed.   lactulose (encephalopathy) 10 GM/15ML Soln Commonly known as:  CHRONULAC Take 20 g by mouth 3 (three) times daily.   loratadine 10 MG tablet Commonly known as:  CLARITIN Take 10 mg by mouth daily.   meperidine 50 MG tablet Commonly known as:  DEMEROL Take 1 tablet (50 mg total) by mouth every 4 (four) hours as needed for moderate pain (Breakthrough pain).   methadone 10 MG tablet Commonly known as:  DOLOPHINE Take 2 tablets (20MG ) by mouth  every 5 to 6 hours as needed for pain - max 9 tablets daily   prochlorperazine 5 MG tablet Commonly known as:  COMPAZINE Take 5 mg by mouth every 8 (eight) hours as needed for nausea or vomiting.   senna 8.6 MG Tabs tablet Commonly known as:  SENOKOT Take 1 tablet (8.6 mg total) by mouth 2 (two) times daily.   temazepam 30 MG capsule Commonly known as:  RESTORIL Take 1 capsule (30 mg total) by mouth at bedtime.   topiramate 50 MG tablet Commonly known as:  TOPAMAX Take 1 tablet (50 mg total) by mouth at bedtime as needed and may repeat dose one time if needed (ha).        DISCHARGE INSTRUCTIONS:   If you experience worsening of your admission symptoms, develop shortness of breath, life threatening emergency, suicidal or homicidal thoughts you must seek medical attention immediately by calling 911 or calling your MD immediately  if symptoms less severe.  You Must read complete instructions/literature along with all the possible adverse reactions/side effects for all the Medicines you take and that have been prescribed to you. Take any new Medicines after you have completely understood and accept all the possible adverse reactions/side effects.   Please note  You were cared for by a hospitalist during your hospital stay. If you have any questions about your discharge medications or the care you received while you were in the hospital after you are discharged, you can call the unit and asked to speak with the hospitalist on call if the hospitalist that took care of you is not available. Once you are discharged, your primary care physician will handle any further medical issues. Please note that NO REFILLS for any discharge medications will be authorized once you are discharged, as it is imperative that you return to your primary care physician (or establish a relationship with a primary care physician if you do not have one) for your aftercare needs so that they can reassess your need for  medications and monitor your lab values.    Today   CHIEF COMPLAINT:   Chief Complaint  Patient presents with  . Chest Pain  . Back Pain  . Abdominal Pain    HISTORY OF PRESENT ILLNESS:  67 y.o. 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  VITAL SIGNS:  Blood pressure (!) 167/82, pulse 61, temperature (!) 97.5 F (36.4  C), temperature source Oral, resp. rate 16, height 4\' 11"  (1.499 m), weight 102.7 kg (226 lb 6.4 oz), SpO2 94 %.  I/O:    Intake/Output Summary (Last 24 hours) at 12/20/2017 1114 Last data filed at 12/20/2017 1005 Gross per 24 hour  Intake 360 ml  Output -  Net 360 ml    PHYSICAL EXAMINATION:  GENERAL:  67 y.o.-year-old patient lying in the bed with no acute distress.  EYES: Pupils equal, round, reactive to light and accommodation. No scleral icterus. Extraocular muscles intact.  HEENT: Head atraumatic, normocephalic. Oropharynx and nasopharynx clear.  NECK:  Supple, no jugular venous distention. No thyroid enlargement, no tenderness.  LUNGS: Normal breath sounds bilaterally, no wheezing, rales,rhonchi or crepitation. No use of accessory muscles of respiration.  CARDIOVASCULAR: S1, S2 normal. No murmurs, rubs, or gallops.  ABDOMEN: Soft, non-tender, non-distended. Bowel sounds present. No organomegaly or mass.  EXTREMITIES: No pedal edema, cyanosis, or clubbing.  NEUROLOGIC:  Cranial nerves II through XII are intact. Muscle strength 5/5 in all extremities. Sensation intact. Gait not checked.  PSYCHIATRIC: The patient is alert and oriented x 3.  SKIN: No obvious rash, lesion, or ulcer.   DATA REVIEW:   CBC Recent Labs  Lab 12/18/17 2201  WBC 4.8  HGB 12.5  HCT 38.5  PLT 182    Chemistries  Recent Labs  Lab 12/18/17 2201  NA 130*  K 3.7  CL 95*  CO2 26  GLUCOSE 131*  BUN 10  CREATININE 0.58  CALCIUM 8.7*  AST 95*  ALT 95*  ALKPHOS 122  BILITOT 0.5    Cardiac Enzymes Recent Labs  Lab 12/18/17 2201  TROPONINI <0.03    Microbiology Results  Results for orders placed or performed during the hospital encounter of 06/24/16  Urine culture     Status: None   Collection Time: 06/24/16  1:47 PM  Result Value Ref Range Status   Specimen Description URINE, RANDOM  Final   Special Requests NONE  Final   Culture NO GROWTH Performed at [facility]   Final   Report Status 06/26/2016 FINAL  Final    RADIOLOGY:  Dg Chest 2 View  Result Date: 12/18/2017 CLINICAL DATA:  Chest pain, abdominal pain and back pain EXAM: CHEST - 2 VIEW COMPARISON:  04/10/2016 FINDINGS: The heart size and mediastinal contours are within normal limits. Mild aortic atherosclerosis at the arch. No aneurysm. No pulmonary consolidation or CHF. No effusion or pneumothorax. Stable degenerative change along the included cervical and thoracic spine. IMPRESSION: No active cardiopulmonary disease.  Minimal aortic atherosclerosis. Electronically Signed   By: Ashley Royalty M.D.   On: 12/18/2017 22:26   Ct Abdomen Pelvis W Contrast  Result Date: 12/19/2017 CLINICAL DATA:  Chest and abdominal pain as well as back pain. Stage IV in liver and back. EXAM: CT ABDOMEN AND PELVIS WITH CONTRAST TECHNIQUE: Multidetector CT imaging of the abdomen and pelvis was performed using the standard protocol following bolus administration of intravenous contrast. CONTRAST:  172mL ISOVUE-300  IOPAMIDOL (ISOVUE-300) INJECTION 61% COMPARISON:  06/24/2016 FINDINGS: Lower chest: Stable cardiomegaly. No pericardial effusion. Atelectasis and/or scarring is seen medially within the right lower lobe. No pleural effusion, pneumothorax or pulmonary mass is identified at the lung bases. There is a small hiatal hernia. Para-aortic lymphadenopathy is identified with central areas of hypodensity suggesting necrosis, the largest is at the diaphragmatic hiatus, to the right of the descending thoracic aorta measuring 2.4 cm in short axis. Smaller 11 mm or less short  axis lymph nodes are seen just cephalad to this finding. Hepatobiliary: Morphologic appearance of cirrhosis. Hypodense mass in the right hepatic lobe measuring 2.4 x 2.5 x 3.1 cm is noted compatible with hepatocellular carcinoma the setting of cirrhosis. Within the porta hepatis, there is an ill-defined 5.6 x 7.2 x 8.3 cm masslike abnormality involving the main portal vein without occlusion as well as the common bile duct. It also appears to involve the pancreatic head and uncinate process. Findings may represent localized extension of tumor into porta hepatic lymph nodes and pancreas. Pancreas: Abnormal masslike enlargement with central areas of necrosis involving pancreatic head and uncinate process suspicious for localized extension of tumor from the liver or possibly representing pancreatic mass with adjacent peripancreatic adenopathy. Given cirrhotic appearance of the liver and additional hypodense masslike abnormality within the right hepatic lobe, findings are felt to represent localized extension from the liver to adjacent porta hepatic nodes and adjacent pancreas. Negative pancreatic ductal dilatation. Spleen: No splenic mass or splenomegaly. Adrenals/Urinary Tract: Normal bilateral adrenal glands. Symmetric cortical enhancement of both kidneys redemonstration of nonobstructing left-sided renal calculi, the largest is approximately 12 mm in the  lower. No hydroureteronephrosis. Decompressed urinary bladder. Stomach/Bowel: Small hiatal hernia. Decompressed stomach. The duodenum is seen just lateral to the previously described right upper quadrant masslike abnormality involving porta hepatis and pancreas. No bowel obstruction or inflammation. Large amount of stool is seen within the colon. Vascular/Lymphatic: Adenopathy at the diaphragmatic hiatus as above described. Additionally there is a 2.7 cm short axis lymph node adjacent to the previously described porta hepatic mass. Aortoiliac atherosclerosis without aneurysm. Epidural venous engorgement within the spinal canal likely from more upstream vascular obstruction possibly due to adenopathy at the diaphragmatic hiatus. Reproductive: Status post hysterectomy. No adnexal masses. Other: No abdominal wall hernia or abnormality. No abdominopelvic ascites. Musculoskeletal: No acute osseous abnormality. Multi level degenerative facet arthropathy of the lumbar spine. Stable sclerotic density in the right acetabulum. No aggressive osseous lesions. IMPRESSION: 1. Hepatic cirrhosis with new ill-defined right hepatic 2.4 x 2.5 x 3.1 cm mass consistent with hepatocellular carcinoma in the setting of cirrhosis. 2. New porta hepatic partially necrotic appearing mass enveloping the main portal vein, common bile duct and portions of the pancreatic head and uncinate process. Findings raise concern for localized extension of hepatic tumor to porta hepatic nodes and pancreas and would be keeping with stage IV hepatic cancer. 3. New enlarged para-aortic adenopathy at the diaphragmatic hiatus as well as aortocaval adenopathy adjacent to the porta hepatic mass. 4. Constipation. Electronically Signed   By: Ashley Royalty M.D.   On: 12/19/2017 02:20    EKG:   Orders placed or performed during the hospital encounter of 12/18/17  . ED EKG  . ED EKG      Management plans discussed with the patient, family and they are in  agreement.  CODE STATUS:     Code Status Orders  (From admission, onward)        Start     Ordered   12/19/17 0526  Do not attempt resuscitation (DNR)  Continuous    Question Answer Comment  In the event of cardiac or respiratory ARREST Do not call a "code blue"   In the event of cardiac or respiratory ARREST Do not perform Intubation, CPR, defibrillation or ACLS   In the event of cardiac or respiratory ARREST Use medication by any route, position, wound care, and other measures to relive pain and suffering. May use oxygen, suction and manual  treatment of airway obstruction as needed for comfort.      12/19/17 0525    Code Status History    Date Active Date Inactive Code Status Order ID Comments User Context   12/19/2017 05:25 12/19/2017 05:25 DNR [id number]  SalaryAvel Peace, MD Inpatient      TOTAL TIME TAKING CARE OF THIS PATIENT: 45 minutes.    Avel Peace Philopater Mucha M.D on 12/20/2017 at 11:14 AM  Between 7am to 6pm - Pager - [phone]  After 6pm go to [email or web address] - password [email or web address] [facility]  Office  [phone]  CC: Primary care physician; Delena Serve, MD   Note: This dictation was prepared with Dragon dictation along with smaller phrase technology. Any transcriptional errors that result from this process are unintentional.

## 2017-12-20 NOTE — Progress Notes (Signed)
Family Meeting Note  Advance Directive:yes  Today a meeting took place with the Patient.  Patient able to participate   The following clinical team members were present during this meeting:MD  The following were discussed:Patient's diagnosis: Metastatic hepatocellular cancer, Patient's progosis: < 12 months and Goals for treatment: DNR, comfort care, home with hospice  Additional follow-up to be provided: Home with hospice  Time spent during discussion:30 minutes  Gorden Harms, MD

## 2017-12-20 NOTE — Progress Notes (Addendum)
Palliative:  I have had conversations with patient Ms. Gail Willis along with sister, brother, chaplain at bedside. She seems in better spirits today having her loved ones at her bedside. She is in no distress. Also spoke later with daughter. Ms. Gail Willis still has a visceral reaction when discussing hospice and becomes choked up and can barely speak when hospice is mentioned. Her existential suffering is vast. I tried to reassure her that having hospice would be helpful as a support to help her feel as good as she can for as long as she can. Also introduced this as a safety net to help keep her out of the hospital. Encouraged family to continue discussions regarding hospice at home. She cannot bring herself to have hospice at this time. We agree to add outpatient palliative care as a support and bridge to hospice and to continue conversations.   I continue to recommend for pain management with acute on chronic abd pain with hepatocellular carcinoma: - D/C methadone.  - Continue Fentanyl patch (will likely need further titration to obtain pain control). This will be safer for her at home.  - Recommend dilaudid po 0.5-1 mg every 4 hours prn breakthrough pain.  - Refuses gabapentin.  - Continue Clonazepam BID prn for anxiety but recommend 1 mg daily prn and 2 mg qhs prn.  - Follows with Dr. Delena Serve for pain management.   Will add Dulcolax suppository x 1 for constipation (no results from Lactulose yesterday). LBM 12/16/17. Continue Lactulose at home with dulcolax suppository daily prn moderate constipation.   69 min  Vinie Sill, NP Palliative Medicine Team Pager # [phone] (M-F 8a-5p) Team Phone # [phone] (Nights/Weekends)

## 2017-12-20 NOTE — Care Management Note (Signed)
Case Management Note  Patient Details  Name: Gail Willis MRN: [id number] Date of Birth: 01-20-1951  Subjective/Objective:  Met with patient and her daughter at bedside. Patient lives at home alone. Over all she is independent with adls. They met with palliative and have declined hospices. They have agreed to Palliative care with [facility]. Discussed discharge planning. Offered choice of home health agencies. Daughter and patient chose [facility]. Will set up RN, SW and HHA. She declined PT. Referral to [place] with [facility].                  Action/Plan: [facility] for RN, SW and HHA.   Expected Discharge Date:  12/20/17               Expected Discharge Plan:  [place]  In-House Referral:     Discharge planning Services  CM Consult  Post Acute Care Choice:  Home Health Choice offered to:  Patient, Adult Children  DME Arranged:    DME Agency:     HH Arranged:  Nurse's Aide, Social Work [employer] Agency:  [facility] [employer] [facility] [employer]  Status of Service:  Completed, signed off  If discussed at [employer], dates discussed:    Additional Comments:  Jolly Mango, RN 12/20/2017, 2:08 PM

## 2017-12-20 NOTE — Progress Notes (Signed)
New out patient PALLIATIVE referral received from Palliative Medicine NP Vinie Sill. [place] aware. Plan is for discharge today. Patient information faxed to referral. Flo Shanks RN, BSN, [facility] [place], hospital liaison [phone]

## 2017-12-25 ENCOUNTER — Telehealth: Payer: Self-pay

## 2017-12-25 NOTE — Telephone Encounter (Signed)
EMMI Follow-up: Report today shows patient had questions about her discharge papers.  Left a message at 10:39 am. Will follow-up again.

## 2017-12-26 ENCOUNTER — Telehealth: Payer: Self-pay

## 2017-12-26 NOTE — Telephone Encounter (Signed)
EMMI Follow-up: 2nd follow-up call, left message with my number.

## 2018-05-03 DEATH — deceased

## 2019-05-28 IMAGING — CT CT ABD-PELV W/ CM
2 of 5 series · 14 of 46 positions shown, 16 images · IV contrast (APPLIED)
Comparison: 06/24/2016

CLINICAL DATA: Chest and abdominal pain as well as back pain. Stage
IV in liver and back.

EXAM:
CT ABDOMEN AND PELVIS WITH CONTRAST
TECHNIQUE: Multidetector CT imaging of the abdomen and pelvis was performed
using the standard protocol following bolus administration of
intravenous contrast.
CONTRAST:  100mL 4QT3J9-B55 IOPAMIDOL (4QT3J9-B55) INJECTION 61%

[Series 2: routine abd/pel with · axial · 0.69mm/px · z∈[-486,-101]mm · 11 of 87 slices shown, 13 images]
[im 5/87  soft-tissue]
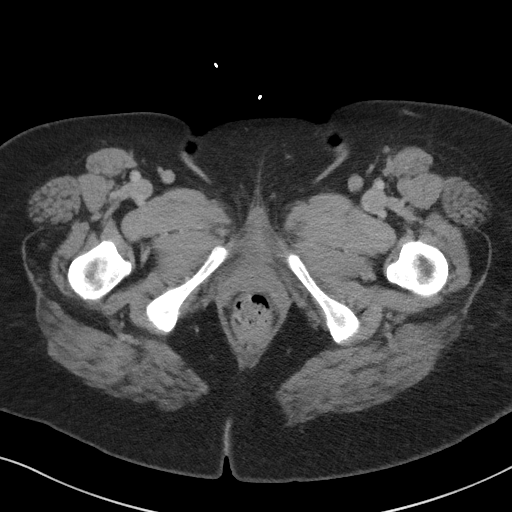
[im 5/87  bone]
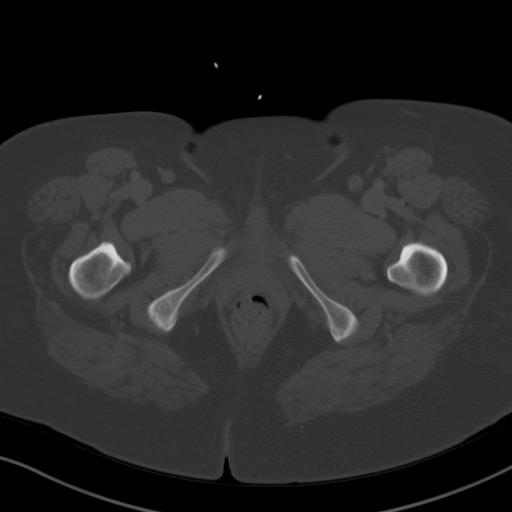
[im 15/87  soft-tissue]
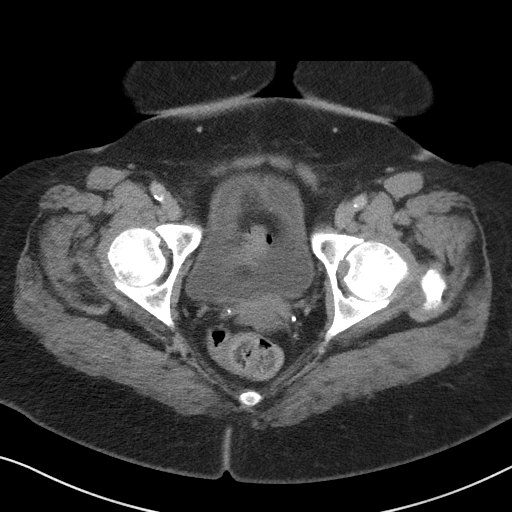
[im 20/87  soft-tissue]
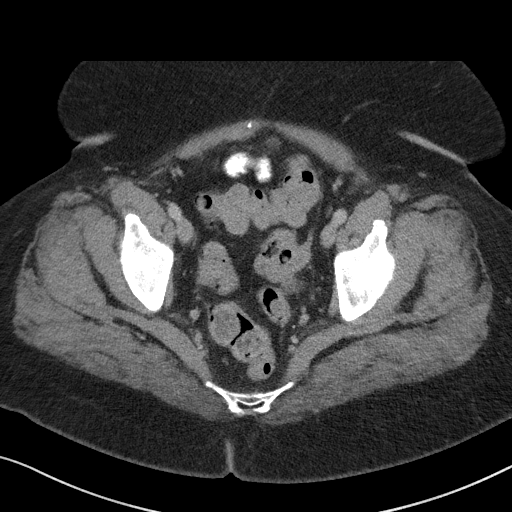
[im 29/87  soft-tissue]
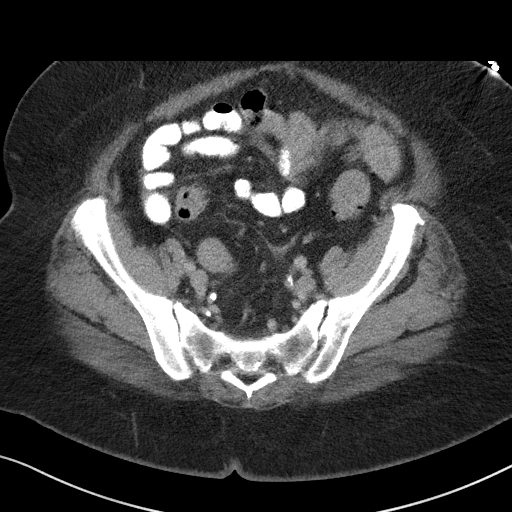
[im 34/87  soft-tissue]
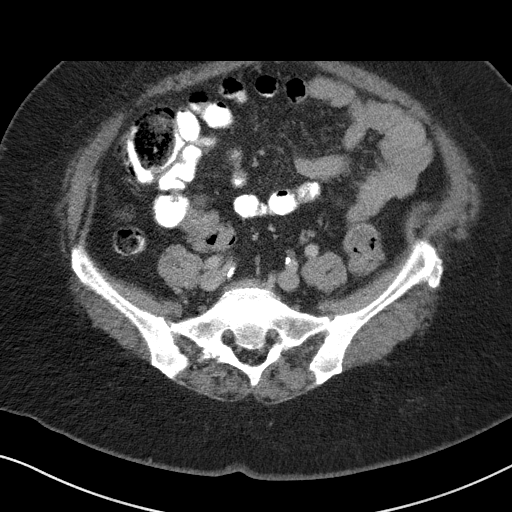
[im 44/87  soft-tissue]
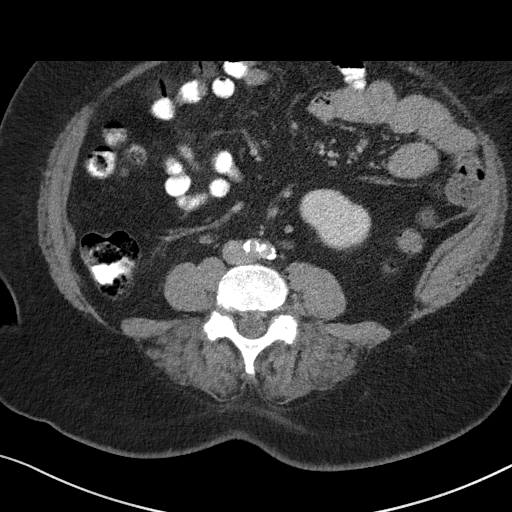
[im 53/87  soft-tissue]
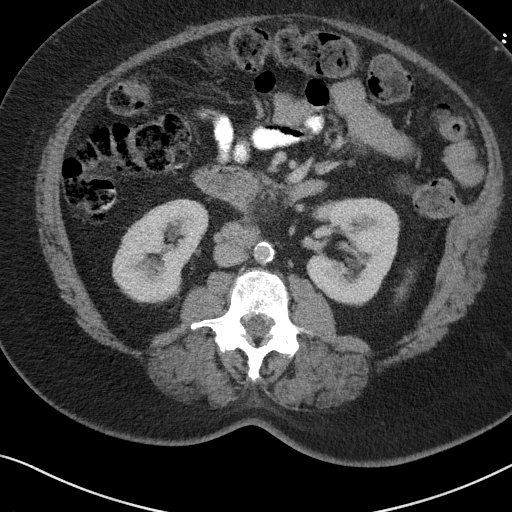
[im 58/87  soft-tissue]
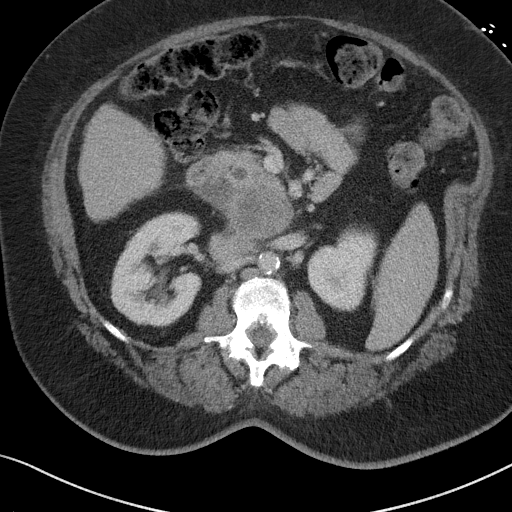
[im 67/87  soft-tissue]
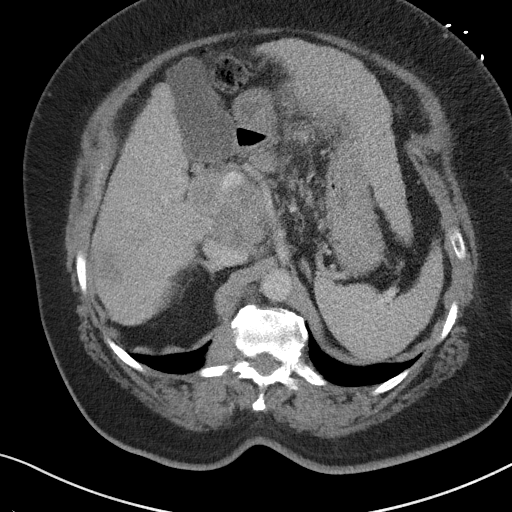
[im 67/87  bone]
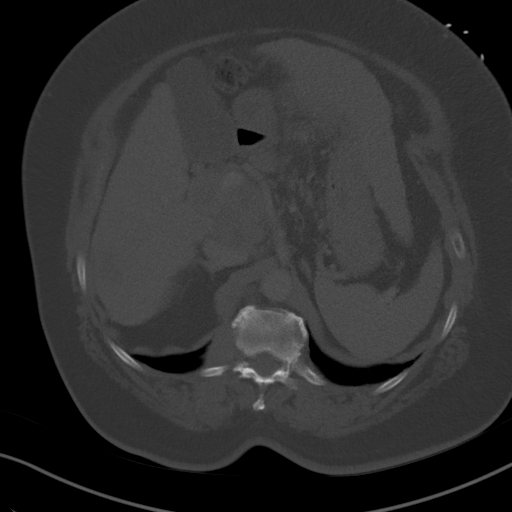
[im 72/87  soft-tissue]
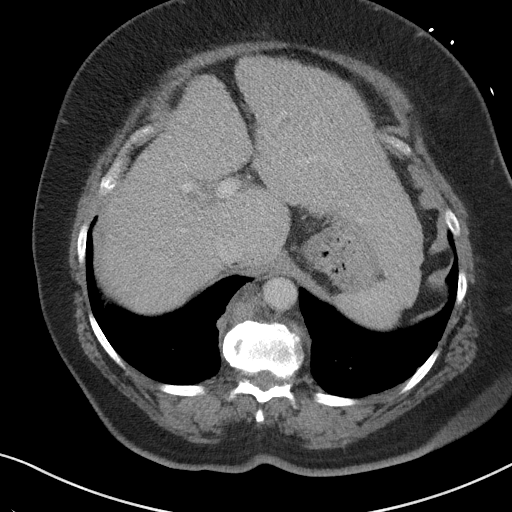
[im 82/87  soft-tissue]
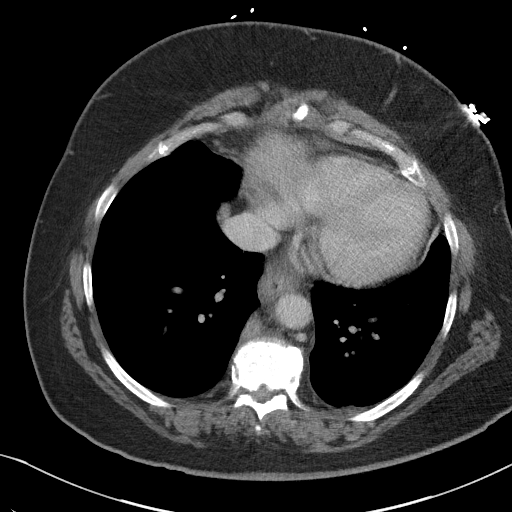

[Series 6: coronal st · coronal · 0.69mm/px · 3 of 108 slices shown]
[im 36/108  soft-tissue]
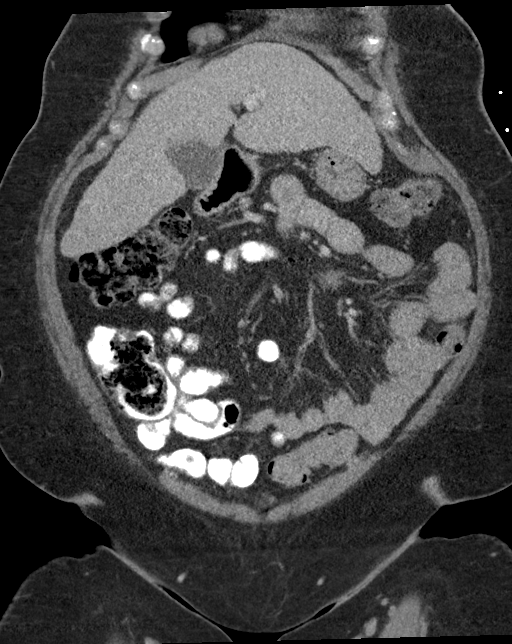
[im 48/108  soft-tissue]
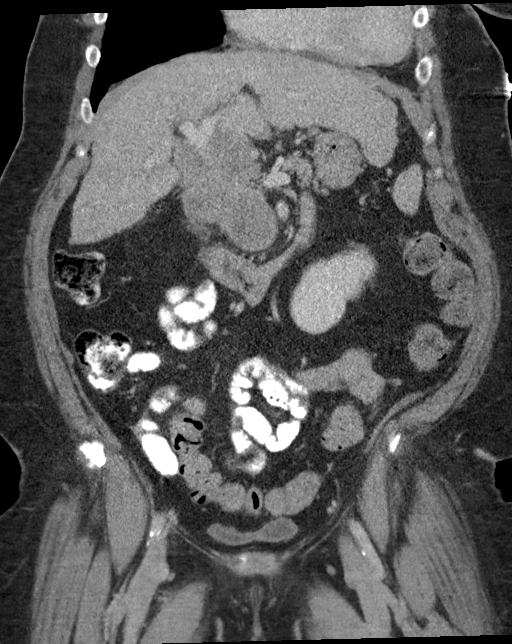
[im 60/108  soft-tissue]
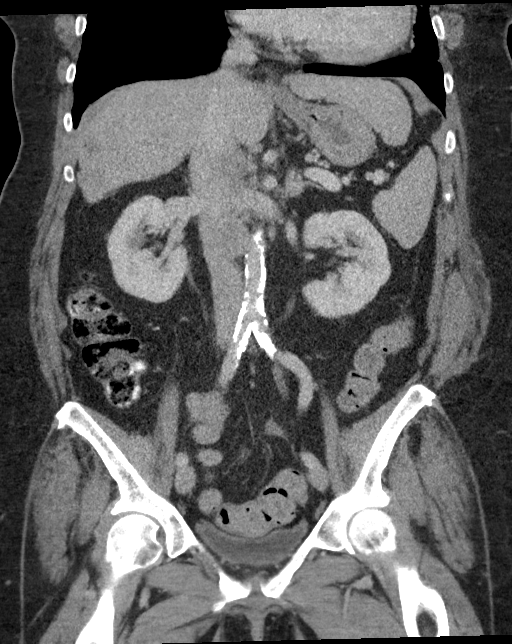

[14 of 46 positions shown; findings below may reference images not displayed]

FINDINGS: Lower chest: Stable cardiomegaly. No pericardial effusion.
Atelectasis and/or scarring is seen medially within the right lower
lobe. No pleural effusion, pneumothorax or pulmonary mass is
identified at the lung bases. There is a small hiatal hernia.
Para-aortic lymphadenopathy is identified with central areas of
hypodensity suggesting necrosis, the largest is at the diaphragmatic
hiatus, to the right of the descending thoracic aorta measuring
cm in short axis. Smaller 11 mm or less short axis lymph nodes are
seen just cephalad to this finding.

Hepatobiliary: Morphologic appearance of cirrhosis. Hypodense mass
in the right hepatic lobe measuring 2.4 x 2.5 x 3.1 cm is noted
compatible with hepatocellular carcinoma the setting of cirrhosis.
Within the porta hepatis, there is an ill-defined 5.6 x 7.2 x 8.3 cm
masslike abnormality involving the main portal vein without
occlusion as well as the common bile duct. It also appears to
involve the pancreatic head and uncinate process. Findings may
represent localized extension of tumor into porta hepatic lymph
nodes and pancreas.

Pancreas: Abnormal masslike enlargement with central areas of
necrosis involving pancreatic head and uncinate process suspicious
for localized extension of tumor from the liver or possibly
representing pancreatic mass with adjacent peripancreatic
adenopathy. Given cirrhotic appearance of the liver and additional
hypodense masslike abnormality within the right hepatic lobe,
findings are felt to represent localized extension from the liver to
adjacent porta hepatic nodes and adjacent pancreas. Negative
pancreatic ductal dilatation.

Spleen: No splenic mass or splenomegaly.

Adrenals/Urinary Tract: Normal bilateral adrenal glands. Symmetric
cortical enhancement of both kidneys redemonstration of
nonobstructing left-sided renal calculi, the largest is
approximately 12 mm in the lower. No hydroureteronephrosis.
Decompressed urinary bladder.

Stomach/Bowel: Small hiatal hernia. Decompressed stomach. The
duodenum is seen just lateral to the previously described right
upper quadrant masslike abnormality involving porta hepatis and
pancreas. No bowel obstruction or inflammation. Large amount of
stool is seen within the colon.

Vascular/Lymphatic: Adenopathy at the diaphragmatic hiatus as above
described. Additionally there is a 2.7 cm short axis lymph node
adjacent to the previously described porta hepatic mass. Aortoiliac
atherosclerosis without aneurysm. Epidural venous engorgement within
the spinal canal likely from more upstream vascular obstruction
possibly due to adenopathy at the diaphragmatic hiatus.

Reproductive: Status post hysterectomy. No adnexal masses.

Other: No abdominal wall hernia or abnormality. No abdominopelvic
ascites.

Musculoskeletal: No acute osseous abnormality. Multi level
degenerative facet arthropathy of the lumbar spine. Stable sclerotic
density in the right acetabulum. No aggressive osseous lesions.
IMPRESSION: 1. Hepatic cirrhosis with new ill-defined right hepatic 2.4 x 2.5 x
3.1 cm mass consistent with hepatocellular carcinoma in the setting
of cirrhosis.
2. New porta hepatic partially necrotic appearing mass enveloping
the main portal vein, common bile duct and portions of the
pancreatic head and uncinate process. Findings raise concern for
localized extension of hepatic tumor to porta hepatic nodes and
pancreas and would be keeping with stage IV hepatic cancer.
3. New enlarged para-aortic adenopathy at the diaphragmatic hiatus
as well as aortocaval adenopathy adjacent to the porta hepatic mass.
4. Constipation.
